# Patient Record
Sex: Male | Born: 1995 | Race: White | Hispanic: No | Marital: Married | Smoking: Never smoker
Health system: Southern US, Community
[De-identification: ages and names within clinical notes are randomized; demographics above are authoritative.]

## PROBLEM LIST (undated history)

## (undated) DIAGNOSIS — K8050 Calculus of bile duct without cholangitis or cholecystitis without obstruction: Secondary | ICD-10-CM

## (undated) HISTORY — PX: TONSILLECTOMY AND ADENOIDECTOMY: SUR1326

---
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
## 2018-05-02 ENCOUNTER — Other Ambulatory Visit: Payer: Self-pay

## 2018-05-02 ENCOUNTER — Ambulatory Visit
Admission: EM | Admit: 2018-05-02 | Discharge: 2018-05-02 | Disposition: A | Discharge status: Home / Self Care | Payer: PRIVATE HEALTH INSURANCE | Attending: Family Medicine | Admitting: Family Medicine

## 2018-05-02 DIAGNOSIS — R69 Illness, unspecified: Secondary | ICD-10-CM

## 2018-05-02 DIAGNOSIS — J111 Influenza due to unidentified influenza virus with other respiratory manifestations: Secondary | ICD-10-CM

## 2018-05-02 LAB — RAPID INFLUENZA A&B ANTIGENS: Influenza B (ARMC): NEGATIVE

## 2018-05-02 LAB — RAPID INFLUENZA A&B ANTIGENS (ARMC ONLY): INFLUENZA A (ARMC): NEGATIVE

## 2018-05-02 MED ORDER — OSELTAMIVIR PHOSPHATE 75 MG PO CAPS: 75.0000 mg | ORAL_CAPSULE | Freq: Two times a day (BID) | ORAL | 0 refills | Status: DC

## 2018-05-02 NOTE — ED Triage Notes (Signed)
Patient complains of body aches, fever, chills, muscle aches. Fever of 102 today-took tylenol around 2 hours ago, reports that wife is currently [redacted] weeks pregnant.

## 2018-05-02 NOTE — ED Provider Notes (Signed)
[facility] ____________________________________________  Time seen: Approximately 2:36 PM  I have reviewed the triage vital signs and the nursing notes.   HISTORY  Chief Complaint Fever   HPI Jacob Acevedo is a 22 y.o. male presented for evaluation of quick onset of chills, body aches, nasal congestion, nausea and diarrhea.  Reports only a few episodes of diarrhea today, described as normal in color.  States some nausea, no vomiting.  Some intermittent abdominal discomfort, denies current abdominal pain.  Denies cough or sore throat. Reports symptoms were quick in onset last night with accompanying fever.  Reports T-max 101.  Last took DayQuil with Tylenol naproxen 2 hours ago.  Current mild body aches.  Does report sick contacts at work, no home sick contacts.  Continues to drink fluids well, decrease in appetite.  Denies chest pain, shortness of breath, dysuria, rash, difficulty moving head or neck, or other complaints.  Reports healthy person.  Denies current medical problems.  Denies other aggravating alleviating factors.  Denies renal insufficiency.  History reviewed. No pertinent past medical history. Denies   There are no active problems to display for this patient.   Past Surgical History:  Procedure Laterality Date  . TONSILLECTOMY AND ADENOIDECTOMY       No current facility-administered medications for this encounter.   Current Outpatient Medications:  .  oseltamivir (TAMIFLU) 75 MG capsule, Take 1 capsule (75 mg total) by mouth every 12 (twelve) hours., Disp: 10 capsule, Rfl: 0  Allergies Patient has no known allergies.  Family History  Problem Relation Age of Onset  . Diabetes Father     Social History Social History   Tobacco Use  . Smoking status: Never Smoker  . Smokeless tobacco: Current User    Types: Snuff  Substance Use Topics  . Alcohol use: Yes    Comment: occasionally  . Drug use: Not Currently    Review of  Systems Constitutional: Positive fever ENT: No sore throat. Cardiovascular: Denies chest pain. Respiratory: Denies shortness of breath. Gastrointestinal: As above.  Denies blood in stool. Genitourinary: Negative for dysuria. Musculoskeletal: Negative for back pain. Skin: Negative for rash.   ____________________________________________   PHYSICAL EXAM:  VITAL SIGNS: ED Triage Vitals  Enc Vitals Group     BP 05/02/18 1339 129/72     Pulse Rate 05/02/18 1339 (!) 111     Resp 05/02/18 1339 18     Temp 05/02/18 1339 98.8 F (37.1 C)     Temp Source 05/02/18 1339 Oral     SpO2 05/02/18 1339 98 %     Weight 05/02/18 1340 220 lb (99.8 kg)     Height 05/02/18 1340 5\' 9"  (1.753 m)     Head Circumference --      Peak Flow --      Pain Score 05/02/18 1339 3     Pain Loc --      Pain Edu? --      Excl. in GC? --    Constitutional: Alert and oriented. Well appearing and in no acute distress. Eyes: Conjunctivae are normal.  Head: Atraumatic. No sinus tenderness to palpation. No swelling. No erythema.  Ears: no erythema, normal TMs bilaterally.   Nose:Nasal congestion  Mouth/Throat: Mucous membranes are moist. No pharyngeal erythema. No tonsillar swelling or exudate.  Neck: No stridor.  No cervical spine tenderness to palpation. Hematological/Lymphatic/Immunilogical: No cervical lymphadenopathy. Cardiovascular: Normal rate, regular rhythm. Grossly normal heart sounds.  Good peripheral circulation. Respiratory: Normal respiratory effort.  No retractions.  No wheezes, rales or rhonchi. Good air movement.  Gastrointestinal: Soft and nontender. Normal Bowel sounds. No CVA tenderness. Musculoskeletal: Ambulatory with steady gait. No cervical, thoracic or lumbar tenderness to palpation. Neurologic:  Normal speech and language. No gait instability. Skin:  Skin appears warm, dry and intact. No rash noted. Psychiatric: Mood and affect are normal. Speech and behavior are  normal.  ___________________________________________   LABS (all labs ordered are listed, but only abnormal results are displayed)  Labs Reviewed  RAPID INFLUENZA A&B ANTIGENS ([facility] ONLY)   ____________________________________________  RADIOLOGY  No results found. ____________________________________________   PROCEDURES Procedures    INITIAL IMPRESSION / ASSESSMENT AND PLAN / ED COURSE  Pertinent labs & imaging results that were available during my care of the patient were reviewed by me and considered in my medical decision making (see chart for details).  Well-appearing patient.  No acute distress.  Reports quick onset of above symptoms.  Suspect viral illness such as influenza.  Influenza test negative.  However suspect influenza-like illness.  Discussed use of Tamiflu, patient requests Rx.  Will treat with Tamiflu, over-the-counter Tylenol ibuprofen, rest, fluids, supportive care.Discussed indication, risks and benefits of medications with patient.  Discussed follow up with Primary care physician this week. Discussed follow up and return parameters including no resolution or any worsening concerns. Patient verbalized understanding and agreed to plan.   ____________________________________________   FINAL CLINICAL IMPRESSION(S) / ED DIAGNOSES  Final diagnoses:  Influenza-like illness     ED Discharge Orders         Ordered    oseltamivir (TAMIFLU) 75 MG capsule  Every 12 hours     05/02/18 1416           Note: This dictation was prepared with Dragon dictation along with smaller phrase technology. Any transcriptional errors that result from this process are unintentional.         Renford Dills, NP 05/02/18 1441

## 2018-05-02 NOTE — Discharge Instructions (Signed)
Take medication as prescribed. Rest. Drink plenty of fluids. Tylenol and ibuprofen as needed.  ° °Follow up with your primary care physician this week as needed. Return to Urgent care for new or worsening concerns.  ° °

## 2020-05-06 ENCOUNTER — Ambulatory Visit
Admission: EM | Admit: 2020-05-06 | Discharge: 2020-05-06 | Disposition: A | Discharge status: Home / Self Care | Payer: 59 | Attending: Emergency Medicine | Admitting: Emergency Medicine

## 2020-05-06 ENCOUNTER — Other Ambulatory Visit: Payer: Self-pay

## 2020-05-06 DIAGNOSIS — J069 Acute upper respiratory infection, unspecified: Secondary | ICD-10-CM

## 2020-05-06 MED ORDER — IPRATROPIUM BROMIDE 0.06 % NA SOLN: 2.0000 | Freq: Four times a day (QID) | NASAL | 0 refills | Status: DC | PRN

## 2020-05-06 NOTE — ED Provider Notes (Signed)
[facility]    CSN: [id number] Arrival date & time: 05/06/20  1415      History   Chief Complaint Chief Complaint  Patient presents with  . Nasal Congestion    HPI Jacob Acevedo is a 24 y.o. male.   Jacob Acevedo presents with complaints of congestion which started two days ago. Started out as nasal drainage and facial pressure. Today now with some cough and chest congestion as well. Did a telemedicine visit and was prescribed amoxicillin. Seeking further advice. States had similar about a month ago in which he took a course of amoxicillin, feels like symptoms improved but still had mild congestion which lingered. His son attends daycare and has had frequent URI's, currently with URI. [name] tested yesterday and today for covid-19 which was negative. No gi symptoms. He started zyrtec and flonase again two days ago. No shortness of breath . Cough is productive of mucus. Minimal sore throat. No ear pain. No skin rash. Works in [employer].     ROS per HPI, negative if not otherwise mentioned.      History reviewed. No pertinent past medical history.  There are no problems to display for this patient.   Past Surgical History:  Procedure Laterality Date  . TONSILLECTOMY AND ADENOIDECTOMY         Home Medications    Prior to Admission medications   Medication Sig Start Date End Date Taking? Authorizing Provider  ipratropium (ATROVENT) 0.06 % nasal spray Place 2 sprays into both nostrils 4 (four) times daily as needed for rhinitis. 05/06/20   Georgetta Haber, NP  oseltamivir (TAMIFLU) 75 MG capsule Take 1 capsule (75 mg total) by mouth every 12 (twelve) hours. 05/02/18   Renford Dills, NP    Family History Family History  Problem Relation Age of Onset  . Diabetes Father     Social History Social History   Tobacco Use  . Smoking status: Never Smoker  . Smokeless tobacco: Current User    Types: Snuff  Vaping Use  . Vaping Use: Never used    Substance Use Topics  . Alcohol use: Yes    Comment: occasionally  . Drug use: Not Currently     Allergies   Patient has no known allergies.   Review of Systems Review of Systems   Physical Exam Triage Vital Signs ED Triage Vitals  Enc Vitals Group     BP 05/06/20 1449 128/84     Pulse Rate 05/06/20 1449 93     Resp 05/06/20 1449 18     Temp 05/06/20 1449 98.5 F (36.9 C)     Temp Source 05/06/20 1449 Oral     SpO2 05/06/20 1449 100 %     Weight 05/06/20 1448 215 lb (97.5 kg)     Height 05/06/20 1448 5\' 10"  (1.778 m)     Head Circumference --      Peak Flow --      Pain Score 05/06/20 1448 7     Pain Loc --      Pain Edu? --      Excl. in GC? --    No data found.  Updated Vital Signs BP 128/84 (BP Location: Right Arm)   Pulse 93   Temp 98.5 F (36.9 C) (Oral)   Resp 18   Ht 5\' 10"  (1.778 m)   Wt 215 lb (97.5 kg)   SpO2 100%   BMI 30.85 kg/m   Visual Acuity Right Eye Distance:  Left Eye Distance:   Bilateral Distance:    Right Eye Near:   Left Eye Near:    Bilateral Near:     Physical Exam Constitutional:      Appearance: He is well-developed.  HENT:     Nose: Congestion and rhinorrhea present.     Right Sinus: No maxillary sinus tenderness or frontal sinus tenderness.     Left Sinus: No maxillary sinus tenderness or frontal sinus tenderness.     Mouth/Throat:     Mouth: Mucous membranes are moist.  Cardiovascular:     Rate and Rhythm: Normal rate.  Pulmonary:     Effort: Pulmonary effort is normal.     Comments: Occasional congested cough Skin:    General: Skin is warm and dry.  Neurological:     Mental Status: He is alert and oriented to person, place, and time.      UC Treatments / Results  Labs (all labs ordered are listed, but only abnormal results are displayed) Labs Reviewed - No data to display  EKG   Radiology No results found.  Procedures Procedures (including critical care time)  Medications Ordered in  UC Medications - No data to display  Initial Impression / Assessment and Plan / UC Course  I have reviewed the triage vital signs and the nursing notes.  Pertinent labs & imaging results that were available during my care of the patient were reviewed by me and considered in my medical decision making (see chart for details).     Non toxic. Benign physical exam.  History and physical consistent with viral illness.  Already has had negative covid testing. conntinue with supportive cares. Discussed taking amoxicillin as prescribed or holding off and waiting as this does not appear obviously bacterial in nature at this time. Return precautions provided. Patient verbalized understanding and agreeable to plan.   Final Clinical Impressions(s) / UC Diagnoses   Final diagnoses:  Upper respiratory tract infection, unspecified type     Discharge Instructions     Push fluids to ensure adequate hydration and keep secretions thin. Mucinex as an expectorant.  Warm compresses to your face or warm showers to promote drainage.  Continue with daily zyrtec and flonase.  You may start amoxicillin as prescribed, or you could wait until the end of the week to see how your symptoms are doing. If any improvement or no worsening you may not need at all.  If worsening- fevers, shortness of breath  or difficulty breathing or otherwise worsening don't hesitate to return.    ED Prescriptions    Medication Sig Dispense Auth. Provider   ipratropium (ATROVENT) 0.06 % nasal spray Place 2 sprays into both nostrils 4 (four) times daily as needed for rhinitis. 15 mL Georgetta Haber, NP     PDMP not reviewed this encounter.   Georgetta Haber, NP 05/06/20 1535

## 2020-05-06 NOTE — ED Triage Notes (Signed)
Patient complains of nasal congestion x 2 days. States that he was having to decongest with a shower. Reports that he feels like this has settled in his chest. Reports that he has taken 2 covid test since this started.

## 2020-05-06 NOTE — Discharge Instructions (Signed)
Push fluids to ensure adequate hydration and keep secretions thin. Mucinex as an expectorant.  Warm compresses to your face or warm showers to promote drainage.  Continue with daily zyrtec and flonase.  You may start amoxicillin as prescribed, or you could wait until the end of the week to see how your symptoms are doing. If any improvement or no worsening you may not need at all.  If worsening- fevers, shortness of breath  or difficulty breathing or otherwise worsening don't hesitate to return.

## 2020-08-21 ENCOUNTER — Other Ambulatory Visit: Payer: Self-pay

## 2020-08-21 ENCOUNTER — Encounter: Payer: Self-pay | Admitting: Emergency Medicine

## 2020-08-21 ENCOUNTER — Ambulatory Visit
Admission: RE | Admit: 2020-08-21 | Discharge: 2020-08-21 | Disposition: A | Discharge status: Home / Self Care | Payer: 59 | Source: Ambulatory Visit | Attending: Emergency Medicine | Admitting: Emergency Medicine

## 2020-08-21 ENCOUNTER — Telehealth: Payer: Self-pay | Admitting: Emergency Medicine

## 2020-08-21 ENCOUNTER — Ambulatory Visit
Admit: 2020-08-21 | Discharge: 2020-08-21 | Disposition: A | Discharge status: Home / Self Care | Payer: 59 | Attending: Emergency Medicine | Admitting: Emergency Medicine

## 2020-08-21 ENCOUNTER — Ambulatory Visit
Admission: EM | Admit: 2020-08-21 | Discharge: 2020-08-21 | Disposition: A | Discharge status: Home / Self Care | Payer: 59 | Attending: Emergency Medicine | Admitting: Emergency Medicine

## 2020-08-21 DIAGNOSIS — R1032 Left lower quadrant pain: Secondary | ICD-10-CM | POA: Insufficient documentation

## 2020-08-21 DIAGNOSIS — K6389 Other specified diseases of intestine: Secondary | ICD-10-CM | POA: Insufficient documentation

## 2020-08-21 DIAGNOSIS — K760 Fatty (change of) liver, not elsewhere classified: Secondary | ICD-10-CM | POA: Insufficient documentation

## 2020-08-21 LAB — URINALYSIS, COMPLETE (UACMP) WITH MICROSCOPIC
Bacteria, UA: NONE SEEN
Bilirubin Urine: NEGATIVE
Glucose, UA: NEGATIVE mg/dL
Hgb urine dipstick: NEGATIVE
Ketones, ur: NEGATIVE mg/dL
Leukocytes,Ua: NEGATIVE
Nitrite: NEGATIVE
Protein, ur: NEGATIVE mg/dL
RBC / HPF: NONE SEEN RBC/hpf (ref 0–5)
Specific Gravity, Urine: 1.025 (ref 1.005–1.030)
pH: 5 (ref 5.0–8.0)

## 2020-08-21 LAB — CBC WITH DIFFERENTIAL/PLATELET
Abs Immature Granulocytes: 0.02 10*3/uL (ref 0.00–0.07)
Basophils Absolute: 0 10*3/uL (ref 0.0–0.1)
Basophils Relative: 0 %
Eosinophils Absolute: 0 10*3/uL (ref 0.0–0.5)
Eosinophils Relative: 1 %
HCT: 45.8 % (ref 39.0–52.0)
Hemoglobin: 15.8 g/dL (ref 13.0–17.0)
Immature Granulocytes: 0 %
Lymphocytes Relative: 27 %
Lymphs Abs: 1.6 10*3/uL (ref 0.7–4.0)
MCH: 30.2 pg (ref 26.0–34.0)
MCHC: 34.5 g/dL (ref 30.0–36.0)
MCV: 87.4 fL (ref 80.0–100.0)
Monocytes Absolute: 0.6 10*3/uL (ref 0.1–1.0)
Monocytes Relative: 9 %
Neutro Abs: 3.8 10*3/uL (ref 1.7–7.7)
Neutrophils Relative %: 63 %
Platelets: 198 10*3/uL (ref 150–400)
RBC: 5.24 MIL/uL (ref 4.22–5.81)
RDW: 12.5 % (ref 11.5–15.5)
WBC: 6.1 10*3/uL (ref 4.0–10.5)
nRBC: 0 % (ref 0.0–0.2)

## 2020-08-21 LAB — COMPREHENSIVE METABOLIC PANEL
ALT: 101 U/L — ABNORMAL HIGH (ref 0–44)
AST: 37 U/L (ref 15–41)
Albumin: 4.7 g/dL (ref 3.5–5.0)
Alkaline Phosphatase: 46 U/L (ref 38–126)
Anion gap: 9 (ref 5–15)
BUN: 12 mg/dL (ref 6–20)
CO2: 26 mmol/L (ref 22–32)
Calcium: 9.4 mg/dL (ref 8.9–10.3)
Chloride: 104 mmol/L (ref 98–111)
Creatinine, Ser: 0.79 mg/dL (ref 0.61–1.24)
GFR, Estimated: >60 mL/min (ref >60)
Glucose, Bld: 102 mg/dL — ABNORMAL HIGH (ref 70–99)
Potassium: 4.4 mmol/L (ref 3.5–5.1)
Sodium: 139 mmol/L (ref 135–145)
Total Bilirubin: 1 mg/dL (ref 0.3–1.2)
Total Protein: 7.8 g/dL (ref 6.5–8.1)

## 2020-08-21 MED ORDER — IBUPROFEN 600 MG PO TABS
600.0000 mg | ORAL_TABLET | Freq: Four times a day (QID) | ORAL | 0 refills | Status: DC | PRN
Start: 2020-08-21 — End: 2023-09-15

## 2020-08-21 MED ORDER — IOHEXOL 300 MG/ML  SOLN
100.0000 mL | Freq: Once | INTRAMUSCULAR | Status: AC | PRN
  Administered 2020-08-21: 100 mL via INTRAVENOUS

## 2020-08-21 NOTE — ED Triage Notes (Signed)
Patient c/o left lower abdominal pain that started on Wed.  Patient states that he is not sure if he has a hernia.  Patient denies fevers.  Patient denies N/V/D.

## 2020-08-21 NOTE — Telephone Encounter (Signed)
CT scan report epiploic appendagitis.  These results were telephoned to the patient after verifying his identity through name and date of birth.  Will treat with 60 mg of ibuprofen 4 times daily for 5 to 6 days.

## 2020-08-21 NOTE — Discharge Instructions (Addendum)
Please go to the medical mall at [facility] for a CT scan of your abdomen and pelvis to better understand the cause of your left lower quadrant abdominal pain.

## 2020-08-21 NOTE — ED Notes (Signed)
Patient has been approved for CPT [id number]. Obtained via [employer] . Valid for 08/21/2020 through 10/05/2020. Authorization # [id number].

## 2020-08-21 NOTE — ED Provider Notes (Signed)
[facility]    CSN: [id number] Arrival date & time: 08/21/20  1016      History   Chief Complaint Chief Complaint  Patient presents with  . Abdominal Pain    HPI Jacob Acevedo is a 25 y.o. male.   HPI   25 year old male here for evaluation of abdominal pain.  Patient reports that he developed left lower quadrant abdominal pain 2 days ago.  He states that the pain is waxing and waning between a dull and a sharper but never completely goes away.  Patient states that yesterday he was feeling improved but then when it started to increase he thought might be gas, took some anti-gas medication, had a normal bowel movement, and the pain slightly improved.  Patient denies fever, nausea, vomiting, diarrhea, constipation, blood in the stool, or urinary signs or symptoms.  History reviewed. No pertinent past medical history.  There are no problems to display for this patient.   Past Surgical History:  Procedure Laterality Date  . TONSILLECTOMY AND ADENOIDECTOMY         Home Medications    Prior to Admission medications   Medication Sig Start Date End Date Taking? Authorizing Provider  ipratropium (ATROVENT) 0.06 % nasal spray Place 2 sprays into both nostrils 4 (four) times daily as needed for rhinitis. 05/06/20 08/21/20  Georgetta Haber, NP    Family History Family History  Problem Relation Age of Onset  . Diabetes Father     Social History Social History   Tobacco Use  . Smoking status: Never Smoker  . Smokeless tobacco: Current User    Types: Snuff, Chew  Vaping Use  . Vaping Use: Never used  Substance Use Topics  . Alcohol use: Yes    Comment: occasionally  . Drug use: Not Currently     Allergies   Patient has no known allergies.   Review of Systems Review of Systems  Constitutional: Negative for activity change, appetite change and fever.  Gastrointestinal: Positive for abdominal pain. Negative for blood in stool, constipation,  diarrhea, nausea and vomiting.  Genitourinary: Negative for dysuria, frequency, hematuria and urgency.  Skin: Negative.   Hematological: Negative.   Psychiatric/Behavioral: Negative.      Physical Exam Triage Vital Signs ED Triage Vitals [08/21/20 1028]  Enc Vitals Group     BP      Pulse      Resp      Temp      Temp src      SpO2      Weight 220 lb (99.8 kg)     Height 5\' 10"  (1.778 m)     Head Circumference      Peak Flow      Pain Score 4     Pain Loc      Pain Edu?      Excl. in GC?    No data found.  Updated Vital Signs BP 130/90 (BP Location: Right Arm)   Pulse 88   Temp 98.1 F (36.7 C) (Oral)   Resp 16   Ht 5\' 10"  (1.778 m)   Wt 220 lb (99.8 kg)   SpO2 100%   BMI 31.57 kg/m   Visual Acuity Right Eye Distance:   Left Eye Distance:   Bilateral Distance:    Right Eye Near:   Left Eye Near:    Bilateral Near:     Physical Exam Vitals and nursing note reviewed.  Constitutional:      General:  He is not in acute distress.    Appearance: He is well-developed. He is obese. He is not ill-appearing.  HENT:     Head: Normocephalic and atraumatic.  Cardiovascular:     Rate and Rhythm: Normal rate and regular rhythm.     Heart sounds: Normal heart sounds. No murmur heard. No gallop.   Pulmonary:     Effort: Pulmonary effort is normal.     Breath sounds: Normal breath sounds. No wheezing, rhonchi or rales.  Abdominal:     General: Abdomen is protuberant. Bowel sounds are normal. There is no distension.     Palpations: Abdomen is rigid. There is no hepatomegaly or splenomegaly.     Tenderness: There is abdominal tenderness in the left lower quadrant. There is no right CVA tenderness, left CVA tenderness, guarding or rebound.     Hernia: No hernia is present.  Genitourinary:    Penis: Normal.      Testes: Normal. Cremasteric reflex is present.        Right: Mass, tenderness or swelling not present.        Left: Mass, tenderness or swelling not present.   Skin:    General: Skin is warm and dry.     Capillary Refill: Capillary refill takes less than 2 seconds.     Findings: No erythema or rash.  Neurological:     General: No focal deficit present.     Mental Status: He is alert and oriented to person, place, and time.  Psychiatric:        Mood and Affect: Mood normal.        Behavior: Behavior normal.      UC Treatments / Results  Labs (all labs ordered are listed, but only abnormal results are displayed) Labs Reviewed  COMPREHENSIVE METABOLIC PANEL - Abnormal; Notable for the following components:      Result Value   Glucose, Bld 102 (*)    ALT 101 (*)    All other components within normal limits  CBC WITH DIFFERENTIAL/PLATELET  URINALYSIS, COMPLETE (UACMP) WITH MICROSCOPIC    EKG   Radiology No results found.  Procedures Procedures (including critical care time)  Medications Ordered in UC Medications - No data to display  Initial Impression / Assessment and Plan / UC Course  I have reviewed the triage vital signs and the nursing notes.  Pertinent labs & imaging results that were available during my care of the patient were reviewed by me and considered in my medical decision making (see chart for details).   Patient is a very pleasant 25 year old male here for evaluation of left lower abdominal pain that started 2 days ago.  Patient states that the pain is not a burning in nature it is usually just a dull ache which will increase to a sharp pain.  He states that he was awakened from sleep last night with a sharp pain that improved when he applied pressure to his left side of his abdomen and laid on his left side.  Patient has not had any fever, nausea, vomiting, diarrhea, constipation, or genitourinary symptoms.  Physical exam reveals a soft abdomen with left lower quadrant tenderness.  No hernia appreciated on abdominal exam.  Bowel sounds are positive in all 4 quadrants.  Genitourinary exam was unremarkable and there  is no presence of inguinal hernia.  Patient denies any history of constipation or diarrhea.  Differential includes diverticulitis versus early hernia.  Will obtain CBC, CMP, and obtain outpatient CT scan.  Urinalysis is unremarkable.  CMP shows a moderately elevated ALT at 101.  Remainder of transaminases are unremarkable.  Sodium potassium are normal as is the BUN and creatinine.  CBC is likewise unremarkable.  We will discharge patient with a diagnosis of left lower quadrant pain.  He has an outpatient CT scan scheduled at [facility] at the medical mall.  Final Clinical Impressions(s) / UC Diagnoses   Final diagnoses:  Left lower quadrant abdominal pain     Discharge Instructions     Please go to the medical mall at [facility] for a CT scan of your abdomen and pelvis to better understand the cause of your left lower quadrant abdominal pain.    ED Prescriptions    None     PDMP not reviewed this encounter.   Becky Augusta, NP 08/21/20 1205

## 2020-12-02 ENCOUNTER — Other Ambulatory Visit: Payer: Self-pay

## 2020-12-02 ENCOUNTER — Ambulatory Visit
Admission: EM | Admit: 2020-12-02 | Discharge: 2020-12-02 | Disposition: A | Discharge status: Home / Self Care | Payer: 59 | Attending: Internal Medicine | Admitting: Internal Medicine

## 2020-12-02 DIAGNOSIS — S61211A Laceration without foreign body of left index finger without damage to nail, initial encounter: Secondary | ICD-10-CM

## 2020-12-02 NOTE — Discharge Instructions (Addendum)
Keep wound clean and dry for 48 hours. Change the dressing if bleeding goes through. The bleeding should completely stop in a couple of hours with pressure dressing, but if you are soaking the dressing, go to ER since they will have to get hand surgeon to look for a nicked vessel. Stiches need removal in 10 days.  Watch for sign of infection, like redness, hotness or increased pain.

## 2020-12-02 NOTE — ED Provider Notes (Signed)
[facility]    CSN: [id number] Arrival date & time: 12/02/20  1151      History   Chief Complaint Chief Complaint  Patient presents with  . Laceration    Left index    HPI Jacob Acevedo is a 25 y.o. male who presents with L index finger laceration since this am. He smashed it between a hitch and a trailer.Last TD 2 days ago. Denies bone pain and is able to move his finger fine. He felt numbness right after, but ran cold water on his finger and that has resolved since.    History reviewed. No pertinent past medical history.  There are no problems to display for this patient.   Past Surgical History:  Procedure Laterality Date  . TONSILLECTOMY AND ADENOIDECTOMY         Home Medications    Prior to Admission medications   Medication Sig Start Date End Date Taking? Authorizing Provider  ibuprofen (ADVIL) 600 MG tablet Take 1 tablet (600 mg total) by mouth every 6 (six) hours as needed. 08/21/20   Becky Augusta, NP  ipratropium (ATROVENT) 0.06 % nasal spray Place 2 sprays into both nostrils 4 (four) times daily as needed for rhinitis. 05/06/20 08/21/20  Georgetta Haber, NP    Family History Family History  Problem Relation Age of Onset  . Diabetes Father     Social History Social History   Tobacco Use  . Smoking status: Never Smoker  . Smokeless tobacco: Current User    Types: Snuff, Chew  Vaping Use  . Vaping Use: Never used  Substance Use Topics  . Alcohol use: Yes    Comment: occasionally  . Drug use: Not Currently     Allergies   Patient has no known allergies.   Review of Systems Review of Systems  Musculoskeletal: Negative for arthralgias and joint swelling.  Skin: Positive for wound. Negative for color change and pallor.     Physical Exam Triage Vital Signs ED Triage Vitals  Enc Vitals Group     BP 12/02/20 1209 132/85     Pulse Rate 12/02/20 1209 83     Resp 12/02/20 1209 18     Temp 12/02/20 1209 98.1 F (36.7 C)      Temp Source 12/02/20 1209 Oral     SpO2 12/02/20 1209 100 %     Weight 12/02/20 1208 220 lb (99.8 kg)     Height 12/02/20 1208 5\' 10"  (1.778 m)     Head Circumference --      Peak Flow --      Pain Score 12/02/20 1208 2     Pain Loc --      Pain Edu? --      Excl. in GC? --    No data found.  Updated Vital Signs BP 132/85 (BP Location: Left Arm)   Pulse 83   Temp 98.1 F (36.7 C) (Oral)   Resp 18   Ht 5\' 10"  (1.778 m)   Wt 220 lb (99.8 kg)   SpO2 100%   BMI 31.57 kg/m   Visual Acuity Right Eye Distance:   Left Eye Distance:   Bilateral Distance:    Right Eye Near:   Left Eye Near:    Bilateral Near:     Physical Exam Vitals and nursing note reviewed.  Constitutional:      General: He is not in acute distress.    Appearance: He is not toxic-appearing.  HENT:  Head: Normocephalic.     Right Ear: External ear normal.     Left Ear: External ear normal.  Eyes:     General: No scleral icterus.    Conjunctiva/sclera: Conjunctivae normal.  Pulmonary:     Effort: Pulmonary effort is normal.  Musculoskeletal:        General: Normal range of motion.     Cervical back: Neck supple.     Comments: Does not have any bone pain of L index finger  Skin:    General: Skin is warm and dry.     Comments: Has a 2 cm clean laceration on the crease of volar L index finger distally. Has mild bleeding.   Neurological:     Mental Status: He is alert and oriented to person, place, and time.  Psychiatric:        Mood and Affect: Mood normal.        Behavior: Behavior normal.        Thought Content: Thought content normal.        Judgment: Judgment normal.      UC Treatments / Results  Labs (all labs ordered are listed, but only abnormal results are displayed) Labs Reviewed - No data to display  EKG   Radiology No results found.  Procedures Laceration Repair  Date/Time: 12/02/2020 12:43 PM Performed by: Garey Ham, PA-C Authorized by:  Garey Ham, PA-C   Consent:    Consent obtained:  Verbal   Consent given by:  Patient   Procedural risks discussed: has had sutures before. Anesthesia:    Anesthesia method:  Local infiltration   Local anesthetic:  Lidocaine 1% w/o epi Laceration details:    Location:  Finger   Finger location:  L index finger   Length (cm):  2   Depth (mm):  2 Pre-procedure details:    Preparation:  Patient was prepped and draped in usual sterile fashion Exploration:    Wound exploration: wound explored through full range of motion     Contaminated: no   Treatment:    Area cleansed with:  Soap and water   Amount of cleaning:  Standard   Visualized foreign bodies/material removed: no     Debridement:  None Skin repair:    Repair method:  Sutures   Suture size:  4-0   Suture technique:  Simple interrupted   Number of sutures:  3 Approximation:    Approximation:  Close Repair type:    Repair type:  Simple Post-procedure details:    Dressing:  Sterile dressing and antibiotic ointment   Procedure completion:  Tolerated well, no immediate complications Comments:     Splint placed    (including critical care time)  Medications Ordered in UC Medications - No data to display  Initial Impression / Assessment and Plan / UC Course  I have reviewed the triage vital signs and the nursing notes. Laceration L index finger.  See instructions.  Final Clinical Impressions(s) / UC Diagnoses   Final diagnoses:  None   Discharge Instructions   None    ED Prescriptions    None     PDMP not reviewed this encounter.   Garey Ham, [place] 12/02/20 1537

## 2020-12-02 NOTE — ED Triage Notes (Signed)
Pt c/o laceration to the left index finger this morning. Pt does have full ROM to the finger. Pt states finger was smashed between a hitch and a trailer. Pt reports his last Tetanus was 2 years ago.

## 2021-09-16 ENCOUNTER — Other Ambulatory Visit (HOSPITAL_COMMUNITY): Payer: Self-pay | Admitting: Family Medicine

## 2021-09-16 ENCOUNTER — Other Ambulatory Visit: Payer: Self-pay | Admitting: Family Medicine

## 2021-09-16 DIAGNOSIS — R7989 Other specified abnormal findings of blood chemistry: Secondary | ICD-10-CM

## 2021-09-22 ENCOUNTER — Ambulatory Visit
Admission: RE | Admit: 2021-09-22 | Discharge: 2021-09-22 | Disposition: A | Discharge status: Home / Self Care | Payer: 59 | Source: Ambulatory Visit | Attending: Family Medicine | Admitting: Family Medicine

## 2021-09-22 ENCOUNTER — Other Ambulatory Visit: Payer: Self-pay

## 2021-09-22 DIAGNOSIS — R7989 Other specified abnormal findings of blood chemistry: Secondary | ICD-10-CM | POA: Insufficient documentation

## 2021-09-29 ENCOUNTER — Other Ambulatory Visit: Payer: 59

## 2022-09-19 ENCOUNTER — Encounter: Payer: Self-pay | Admitting: Otolaryngology

## 2022-09-21 NOTE — Discharge Instructions (Signed)
[facility] ENDOSCOPIC SINUS SURGERY [facility]  What is Functional Endoscopic Sinus Surgery?  The Surgery involves making the natural openings of the sinuses larger by removing the bony partitions that separate the sinuses from the nasal cavity.  The natural sinus lining is preserved as much as possible to allow the sinuses to resume normal function after the surgery.  In some patients nasal polyps (excessively swollen lining of the sinuses) may be removed to relieve obstruction of the sinus openings.  The surgery is performed through the nose using lighted scopes, which eliminates the need for incisions on the face.  A septoplasty is a different procedure which is sometimes performed with sinus surgery.  It involves straightening the boy partition that separates the two sides of your nose.  A crooked or deviated septum may need repair if is obstructing the sinuses or nasal airflow.  Turbinate reduction is also often performed during sinus surgery.  The turbinates are bony proturberances from the side walls of the nose which swell and can obstruct the nose in patients with sinus and allergy problems.  Their size can be surgically reduced to help relieve nasal obstruction.  What Can Sinus Surgery Do For Me?  Sinus surgery can reduce the frequency of sinus infections requiring antibiotic treatment.  This can provide improvement in nasal congestion, post-nasal drainage, facial pressure and nasal obstruction.  Surgery will NOT prevent you from ever having an infection again, so it usually only for patients who get infections 4 or more times yearly requiring antibiotics, or for infections that do not clear with antibiotics.  It will not cure nasal allergies, so patients with allergies may still require medication to treat their allergies after surgery. Surgery may improve headaches related to sinusitis, however, some people will continue to  require medication to control sinus headaches related to allergies.  Surgery will do nothing for other forms of headache (migraine, tension or cluster).  What Are the Risks of Endoscopic Sinus Surgery?  Current techniques allow surgery to be performed safely with little risk, however, there are rare complications that patients should be aware of.  Because the sinuses are located around the eyes, there is risk of eye injury, including blindness, though again, this would be quite rare. This is usually a result of bleeding behind the eye during surgery, which can effect vision, though there are treatments to protect the vision and prevent permanent injury. More serious complications would include bleeding inside the brain cavity or damage to the brain.This happens when the fluid around the brain leaks out into the sinus cavity.  Again, all of these complications are uncommon, and spinal fluid leaks can be safely managed surgically if they occur.  The most common complication of sinus surgery is bleeding from the nose, which may require packing or cauterization of the nose.  Patients with polyps may experience recurrence of the polyps that would require revision surgery.  Alterations of sense of smell or injury to the tear ducts are also rare complications.   What is the Surgery Like, and what is the Recovery?  The Surgery usually takes a couple of hours to perform, and is usually performed under a general anesthetic (completely asleep).  Patients are usually discharged home after a couple of hours.  Sometimes during surgery it is necessary to pack the nose to control bleeding, and the packing is left in place for 24 - 48 hours, and removed by your surgeon.  If   a septoplasty was performed during the procedure, there is often a splint placed which must be removed after 5-7 days.   Discomfort: Pain is usually mild to moderate, and can be controlled by prescription pain medication or acetaminophen (Tylenol).   Aspirin, Ibuprofen (Advil, Motrin), or Naprosyn (Aleve) should be avoided, as they can cause increased bleeding.  Most patients feel sinus pressure like they have a bad head cold for several days.  Sleeping with your head elevated can help reduce swelling and facial pressure, as can ice packs over the face.  A humidifier may be helpful to keep the mucous and blood from drying in the nose.   Diet: There are no specific diet restrictions, however, you should generally start with clear liquids and a light diet of bland foods because the anesthetic can cause some nausea.  Advance your diet depending on how your stomach feels.  Taking your pain medication with food will often help reduce stomach upset which pain medications can cause.  Nasal Saline Irrigation: It is important to remove blood clots and dried mucous from the nose as it is healing.  This is done by having you irrigate the nose at least 3 - 4 times daily with a salt water solution.  We recommend using NeilMed Sinus Rinse (available at the drug store).  Fill the squeeze bottle with the solution, bend over a sink, and insert the tip of the squeeze bottle into the nose  of an inch.  Point the tip of the squeeze bottle towards the inside corner of the eye on the same side your irrigating.  Squeeze the bottle and gently irrigate the nose.  If you bend forward as you do this, most of the fluid will flow back out of the nose, instead of down your throat.   The solution should be warm, near body temperature, when you irrigate.   Each time you irrigate, you should use a full squeeze bottle.   Note that if you are instructed to use Nasal Steroid Sprays at any time after your surgery, irrigate with saline BEFORE using the steroid spray, so you do not wash it all out of the nose. Another product, Nasal Saline Gel (such as AYR Nasal Saline Gel) can be applied in each nostril 3 - 4 times daily to moisture the nose and reduce scabbing or crusting.  Bleeding:   Bloody drainage from the nose can be expected for several days, and patients are instructed to irrigate their nose frequently with salt water to help remove mucous and blood clots.  The drainage may be dark red or brown, though some fresh blood may be seen intermittently, especially after irrigation.  Do not blow you nose, as bleeding may occur. If you must sneeze, keep your mouth open to allow air to escape through your mouth.  If heavy bleeding occurs: Irrigate the nose with saline to rinse out clots, then spray the nose 3 - 4 times with Afrin Nasal Decongestant Spray.  The spray will constrict the blood vessels to slow bleeding.  Pinch the lower half of your nose shut to apply pressure, and lay down with your head elevated.  Ice packs over the nose may help as well. If bleeding persists despite these measures, you should notify your doctor.  Do not use the Afrin routinely to control nasal congestion after surgery, as it can result in worsening congestion and may affect healing.     Activity: Return to work varies among patients. Most patients will be out   of work at least 5 - 7 days to recover.  Patient may return to work after they are off of narcotic pain medication, and feeling well enough to perform the functions of their job.  Patients must avoid heavy lifting (over 10 pounds) or strenuous physical for 2 weeks after surgery, so your employer may need to assign you to light duty, or keep you out of work longer if light duty is not possible.  NOTE: you should not drive, operate dangerous machinery, do any mentally demanding tasks or make any important legal or financial decisions while on narcotic pain medication and recovering from the general anesthetic.    Call Your Doctor Immediately if You Have Any of the Following: Bleeding that you cannot control with the above measures Loss of vision, double vision, bulging of the eye or black eyes. Fever over 101 degrees Neck stiffness with severe headache,  fever, nausea and change in mental state. You are always encouraged to call anytime with concerns, however, please call with requests for pain medication refills during office hours.  Office Endoscopy: During follow-up visits your doctor will remove any packing or splints that may have been placed and evaluate and clean your sinuses endoscopically.  Topical anesthetic will be used to make this as comfortable as possible, though you may want to take your pain medication prior to the visit.  How often this will need to be done varies from patient to patient.  After complete recovery from the surgery, you may need follow-up endoscopy from time to time, particularly if there is concern of recurrent infection or nasal polyps.  

## 2022-09-29 ENCOUNTER — Ambulatory Visit
Admission: RE | Admit: 2022-09-29 | Discharge: 2022-09-29 | Disposition: A | Discharge status: Home / Self Care | Payer: 59 | Attending: Otolaryngology | Admitting: Otolaryngology

## 2022-09-29 ENCOUNTER — Other Ambulatory Visit: Payer: Self-pay

## 2022-09-29 ENCOUNTER — Encounter
Admission: RE | Disposition: A | Discharge status: Home / Self Care | Payer: Self-pay | Source: Home / Self Care | Attending: Otolaryngology

## 2022-09-29 ENCOUNTER — Encounter: Payer: Self-pay | Admitting: Otolaryngology

## 2022-09-29 ENCOUNTER — Ambulatory Visit: Payer: 59 | Admitting: Anesthesiology

## 2022-09-29 DIAGNOSIS — K219 Gastro-esophageal reflux disease without esophagitis: Secondary | ICD-10-CM | POA: Insufficient documentation

## 2022-09-29 DIAGNOSIS — F17210 Nicotine dependence, cigarettes, uncomplicated: Secondary | ICD-10-CM | POA: Diagnosis not present

## 2022-09-29 DIAGNOSIS — J343 Hypertrophy of nasal turbinates: Secondary | ICD-10-CM | POA: Insufficient documentation

## 2022-09-29 DIAGNOSIS — J342 Deviated nasal septum: Secondary | ICD-10-CM | POA: Diagnosis not present

## 2022-09-29 HISTORY — PX: NASAL TURBINATE REDUCTION: SHX2072

## 2022-09-29 HISTORY — PX: NASAL SEPTOPLASTY W/ TURBINOPLASTY: SHX2070

## 2022-09-29 SURGERY — SEPTOPLASTY, NOSE, WITH NASAL TURBINATE REDUCTION
Anesthesia: General | Site: Nose | Laterality: Right

## 2022-09-29 MED ORDER — DEXAMETHASONE SODIUM PHOSPHATE 4 MG/ML IJ SOLN
INTRAMUSCULAR | Status: DC | PRN
  Administered 2022-09-29: 12 mg via INTRAVENOUS

## 2022-09-29 MED ORDER — ONDANSETRON HCL 4 MG/2ML IJ SOLN
INTRAMUSCULAR | Status: DC | PRN
  Administered 2022-09-29: 4 mg via INTRAVENOUS

## 2022-09-29 MED ORDER — MIDAZOLAM HCL 5 MG/5ML IJ SOLN
INTRAMUSCULAR | Status: DC | PRN
  Administered 2022-09-29: 2 mg via INTRAVENOUS

## 2022-09-29 MED ORDER — OXYCODONE HCL 5 MG/5ML PO SOLN
10.0000 mg | Freq: Once | ORAL | Status: AC
  Administered 2022-09-29: 10 mg via ORAL

## 2022-09-29 MED ORDER — LIDOCAINE HCL (CARDIAC) PF 100 MG/5ML IV SOSY
PREFILLED_SYRINGE | INTRAVENOUS | Status: DC | PRN
  Administered 2022-09-29: 100 mg via INTRAVENOUS

## 2022-09-29 MED ORDER — ROCURONIUM BROMIDE 100 MG/10ML IV SOLN
INTRAVENOUS | Status: DC | PRN
  Administered 2022-09-29: 40 mg via INTRAVENOUS

## 2022-09-29 MED ORDER — HYDROCODONE-ACETAMINOPHEN 5-325 MG PO TABS: 1.0000 | ORAL_TABLET | Freq: Four times a day (QID) | ORAL | 0 refills | Status: AC | PRN

## 2022-09-29 MED ORDER — ACETAMINOPHEN 10 MG/ML IV SOLN
1000.0000 mg | Freq: Once | INTRAVENOUS | Status: AC
  Administered 2022-09-29: 1000 mg via INTRAVENOUS

## 2022-09-29 MED ORDER — DEXMEDETOMIDINE HCL IN NACL 80 MCG/20ML IV SOLN
INTRAVENOUS | Status: DC | PRN
  Administered 2022-09-29: 10 ug via BUCCAL

## 2022-09-29 MED ORDER — LACTATED RINGERS IV SOLN: INTRAVENOUS | Status: DC

## 2022-09-29 MED ORDER — PHENYLEPHRINE HCL 0.5 % NA SOLN
NASAL | Status: DC | PRN
  Administered 2022-09-29: 15 mL via TOPICAL

## 2022-09-29 MED ORDER — PREDNISONE 10 MG PO TABS: ORAL_TABLET | ORAL | 0 refills | Status: DC

## 2022-09-29 MED ORDER — PROPOFOL 10 MG/ML IV BOLUS
INTRAVENOUS | Status: DC | PRN
  Administered 2022-09-29: 200 mg via INTRAVENOUS

## 2022-09-29 MED ORDER — OXYMETAZOLINE HCL 0.05 % NA SOLN
2.0000 | NASAL | Status: AC
  Administered 2022-09-29: 2 via NASAL

## 2022-09-29 MED ORDER — CEPHALEXIN 500 MG PO CAPS: 500.0000 mg | ORAL_CAPSULE | Freq: Two times a day (BID) | ORAL | 0 refills | Status: DC

## 2022-09-29 MED ORDER — FENTANYL CITRATE (PF) 100 MCG/2ML IJ SOLN
INTRAMUSCULAR | Status: DC | PRN
  Administered 2022-09-29: 100 ug via INTRAVENOUS

## 2022-09-29 MED ORDER — DEXTROSE 5 % IV SOLN
2000.0000 mg | Freq: Once | INTRAVENOUS | Status: AC
  Administered 2022-09-29: 2000 mg via INTRAVENOUS

## 2022-09-29 MED ORDER — LIDOCAINE-EPINEPHRINE 1 %-1:100000 IJ SOLN
INTRAMUSCULAR | Status: DC | PRN
  Administered 2022-09-29: 6 mL

## 2022-09-29 MED ORDER — SUGAMMADEX SODIUM 200 MG/2ML IV SOLN
INTRAVENOUS | Status: DC | PRN
  Administered 2022-09-29: 200 mg via INTRAVENOUS

## 2022-09-29 SURGICAL SUPPLY — 27 items
CANISTER SUCT 1200ML W/VALVE (MISCELLANEOUS) ×2 IMPLANT
COAGULATOR SUCT 8FR VV (MISCELLANEOUS) ×2 IMPLANT
ELECT REM PT RETURN 9FT ADLT (ELECTROSURGICAL) ×2
ELECTRODE REM PT RTRN 9FT ADLT (ELECTROSURGICAL) ×2 IMPLANT
GLOVE SURG GAMMEX PI TX LF 7.5 (GLOVE) ×4 IMPLANT
GOWN STRL REUS W/ TWL LRG LVL3 (GOWN DISPOSABLE) ×2 IMPLANT
GOWN STRL REUS W/TWL LRG LVL3 (GOWN DISPOSABLE) ×2
KIT TURNOVER KIT A (KITS) ×2 IMPLANT
NDL ANESTHESIA 27G X 3.5 (NEEDLE) ×2 IMPLANT
NDL HYPO 27GX1-1/4 (NEEDLE) ×2 IMPLANT
NEEDLE ANESTHESIA  27G X 3.5 (NEEDLE)
NEEDLE ANESTHESIA 27G X 3.5 (NEEDLE) IMPLANT
NEEDLE HYPO 27GX1-1/4 (NEEDLE) ×2 IMPLANT
NS IRRIG 500ML POUR BTL (IV SOLUTION) ×2 IMPLANT
PACK ENT CUSTOM (PACKS) ×2 IMPLANT
PACKING NASAL EPIS 4X2.4 XEROG (MISCELLANEOUS) ×2 IMPLANT
PATTIES SURGICAL .5 X3 (DISPOSABLE) ×2 IMPLANT
SPLINT NASAL SEPTAL BLV .50 ST (MISCELLANEOUS) ×2 IMPLANT
STRAP BODY AND KNEE 60X3 (MISCELLANEOUS) ×2 IMPLANT
SUT CHROMIC 3-0 (SUTURE) ×2
SUT CHROMIC 3-0 KS 27XMFL CR (SUTURE) ×2
SUT ETHILON 3-0 KS 30 BLK (SUTURE) ×2 IMPLANT
SUT PLAIN GUT 4-0 (SUTURE) ×2 IMPLANT
SUTURE CHRMC 3-0 KS 27XMFL CR (SUTURE) ×2 IMPLANT
SYR 3ML LL SCALE MARK (SYRINGE) ×2 IMPLANT
TOWEL OR 17X26 4PK STRL BLUE (TOWEL DISPOSABLE) ×2 IMPLANT
WATER STERILE IRR 250ML POUR (IV SOLUTION) ×2 IMPLANT

## 2022-09-29 NOTE — H&P (Signed)
H&P has been reviewed and patient reevaluated, no changes necessary. To be downloaded later.  

## 2022-09-29 NOTE — Anesthesia Procedure Notes (Addendum)
Procedure Name: Intubation Date/Time: 09/29/2022 9:57 AM  Performed by: Sekai Gitlin, [place], CRNAPre-anesthesia Checklist: Patient identified, Patient being monitored, Timeout performed, Emergency Drugs available and Suction available Patient Re-evaluated:Patient Re-evaluated prior to induction Oxygen Delivery Method: Circle system utilized Preoxygenation: Pre-oxygenation with 100% oxygen Induction Type: IV induction Ventilation: Mask ventilation without difficulty Laryngoscope Size: Mac and 4 Grade View: Grade I Tube type: Oral Rae Tube size: 7.0 mm Number of attempts: 1 Airway Equipment and Method: Stylet Placement Confirmation: ETT inserted through vocal cords under direct vision, positive ETCO2 and breath sounds checked- equal and bilateral Secured at: 22 cm Tube secured with: Tape Dental Injury: Teeth and Oropharynx as per pre-operative assessment

## 2022-09-29 NOTE — Anesthesia Preprocedure Evaluation (Addendum)
Anesthesia Evaluation  Patient identified by MRN, date of birth, ID band Patient awake    Airway Mallampati: III  TM Distance: >3 FB     Dental no notable dental hx. (+) Teeth Intact Potentially need videolaryngoscope for intubation:   Pulmonary Current Smoker and Patient abstained from smoking. Seasonal allergies   breath sounds clear to auscultation       Cardiovascular Exercise Tolerance: Good negative cardio ROS  Rhythm:Regular Rate:Normal     Neuro/Psych negative neurological ROS  negative psych ROS   GI/Hepatic Neg liver ROS,GERD  Controlled and Medicated,,  Endo/Other  negative endocrine ROS    Renal/GU negative Renal ROS     Musculoskeletal negative musculoskeletal ROS (+)    Abdominal   Peds  Hematology negative hematology ROS (+)   Anesthesia Other Findings   Reproductive/Obstetrics                              Anesthesia Physical Anesthesia Plan  ASA: 2  Anesthesia Plan: General   Post-op Pain Management:    Induction: Intravenous  PONV Risk Score and Plan: 2  Airway Management Planned: Oral ETT and Video Laryngoscope Planned  Additional Equipment:   Intra-op Plan:   Post-operative Plan:   Informed Consent: I have reviewed the patients History and Physical, chart, labs and discussed the procedure including the risks, benefits and alternatives for the proposed anesthesia with the patient or authorized representative who has indicated his/her understanding and acceptance.       Plan Discussed with: CRNA  Anesthesia Plan Comments:         Anesthesia Quick Evaluation

## 2022-09-29 NOTE — Anesthesia Postprocedure Evaluation (Signed)
Anesthesia Post Note  Patient: Jacob Acevedo  Procedure(s) Performed: NASAL SEPTOPLASTY WITH INFERIOR TURBINATE REDUCTION (Bilateral: Nose) MIDDLE TURBINATE REDUCTION (Right: Nose)  Patient location during evaluation: PACU Anesthesia Type: General Level of consciousness: awake and alert Pain management: pain level controlled Vital Signs Assessment: post-procedure vital signs reviewed and stable Respiratory status: spontaneous breathing, nonlabored ventilation, respiratory function stable and patient connected to nasal cannula oxygen Cardiovascular status: blood pressure returned to baseline and stable Postop Assessment: no apparent nausea or vomiting Anesthetic complications: no   No notable events documented.   Last Vitals:  Vitals:   09/29/22 1115 09/29/22 1130  BP: 112/66 116/74  Pulse: 77 81  Resp:    Temp:  36.4 C  SpO2: 93% 95%    Last Pain:  Vitals:   09/29/22 1123  TempSrc:   PainSc: Asleep                 [name]

## 2022-09-29 NOTE — Op Note (Signed)
09/29/2022  10:54 AM  [id number]   Pre-Op Dx:  Deviated Nasal Septum, Hypertrophic Inferior Turbinates, enlarged right middle turbinate  Post-op Dx: Same  Proc: Nasal Septoplasty, Bilateral Partial Reduction Inferior Turbinates   Surg:  Elon Alas Lashun Mccants  Anes:  GOT  EBL: 50 mL  Comp: None  Findings: Inferior spurs of the maxillary crest to the left side.  The cartilage was overhanging on the right inferiorly.  The ethmoid plate was buckled to the left side.  Procedure: With the patient in a comfortable supine position,  general orotracheal anesthesia was induced without difficulty.     The patient received preoperative Afrin spray for topical decongestion and vasoconstriction.  Intravenous prophylactic antibiotics were administered.  At an appropriate level, the patient was placed in a semi-sitting position.  Nasal vibrissae were trimmed.   1% Xylocaine with 1:100,000 epinephrine, 6 cc's, was infiltrated into the anterior floor of the nose, into the nasal spine region, into the membranous columella, and finally into the submucoperichondrial plane of the septum on both sides.  Several minutes were allowed for this to take effect.  Cottoniod pledgetts soaked in Afrin and 4% Xylocaine were placed into both nasal cavities and left while the patient was prepped and draped in the standard fashion.  The materials were removed from the nose and observed to be intact and correct in number.  The nose was inspected with a headlight and zero degree scope with the findings as described above.  A left Killian incision was sharply executed and carried down to the quadrangular cartilage. The mucoperichondrium was elelvated along the quadrangular plate back to the bony-cartilaginous junction. The mucoperiostium was then elevated along the ethmoid plate and the vomer. The boney-catilaginous junction was then split with a freer elevator and the mucoperiosteum was elevated on the opposite side. The  mucoperiosteum was then elevated along the maxillary crest as needed to expose the crooked bone of the crest.  Boney spurs of the vomer and maxillary crest were removed with Donavan Foil forceps.  The cartilaginous plate was trimmed along its posterior and inferior borders of about 2 mm of cartilage to free it up inferiorly. Some of the deviated ethmoid plate was then fractured and removed with Takahashi forceps to free up the posterior border of the quadrangular plate and allow it to swing back to the midline. The mucosal flaps were placed back into their anatomic position to allow visualization of the airways. The septum now sat in the midline with an improved airway.  A 3-0 Chromic suture on a Keith needle in used to anchor the inferior septum at the nasal spine with a through and through suture. The mucosal flaps are then sutured together using a through and through whip stitch of 4-0 Plain Gut with a mini-Keith needle. This was used to close the [place] incision as well.   The inferior turbinates were then inspected. An incision was created along the inferior aspect of the left inferior turbinate with removal of some of the inferior soft tissue and bone. Electrocautery was used to control bleeding in the area. The remaining turbinate was then outfractured to open up the airway further. There was no significant bleeding noted. The right turbinate was then trimmed and outfractured in a similar fashion.  The airways were then visualized and showed open passageways on both sides that were significantly improved compared to before surgery. There was no signifcant bleeding. Nasal splints were applied to both sides of the septum using Xomed 0.57mm regular sized splints that  were trimmed, and then held in position with a 3-0 Nylon through and through suture.  The patient was turned back over to anesthesia, and awakened, extubated, and taken to the PACU in satisfactory condition.  Dispo:   PACU to home  Plan:  Ice, elevation, narcotic analgesia, steroid taper, and prophylactic antibiotics for the duration of indwelling nasal foreign bodies.  We will reevaluate the patient in the office in 6 days and remove the septal splints.  Return to work in 10 days, strenuous activities in two weeks.   Elon Alas Deneene Tarver 09/29/2022 10:54 AM

## 2022-09-29 NOTE — Transfer of Care (Signed)
Immediate Anesthesia Transfer of Care Note  Patient: Jacob Acevedo  Procedure(s) Performed: NASAL SEPTOPLASTY WITH INFERIOR TURBINATE REDUCTION (Bilateral: Nose) MIDDLE TURBINATE REDUCTION (Right: Nose)  Patient Location: PACU  Anesthesia Type: General  Level of Consciousness: awake, alert  and patient cooperative  Airway and Oxygen Therapy: Patient Spontanous Breathing and Patient connected to supplemental oxygen  Post-op Assessment: Post-op Vital signs reviewed, Patient's Cardiovascular Status Stable, Respiratory Function Stable, Patent Airway and No signs of Nausea or vomiting  Post-op Vital Signs: Reviewed and stable  Complications: No notable events documented.

## 2022-09-30 ENCOUNTER — Encounter: Payer: Self-pay | Admitting: Otolaryngology

## 2023-05-16 IMAGING — US US ABDOMEN COMPLETE
1 series · 14 of 25 positions shown · non-contrast
Comparison: 08/21/2020

CLINICAL DATA: Elevated LFTs

EXAM:
ABDOMEN ULTRASOUND COMPLETE

[Series 1: us abdomen complete · 0.25mm/px · 14 of 82 slices shown]
[im 1/82]
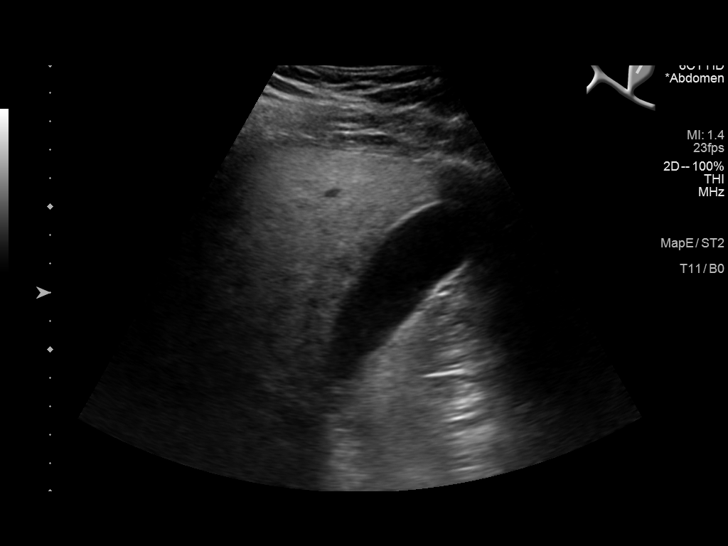
[im 7/82]
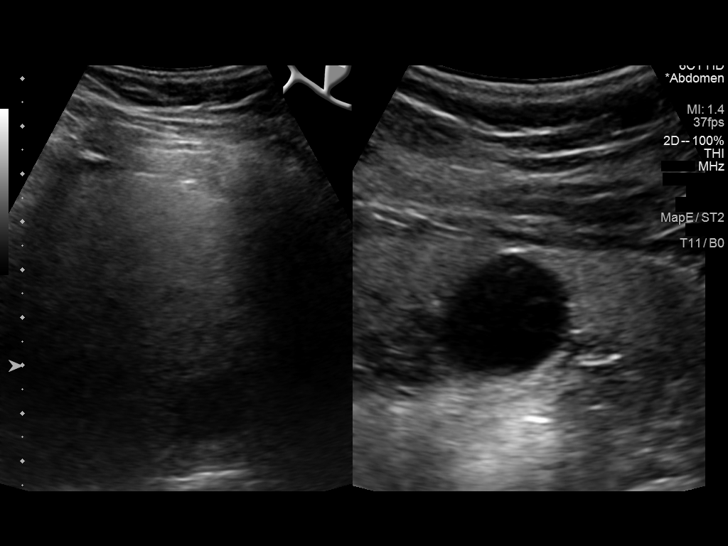
[im 14/82]
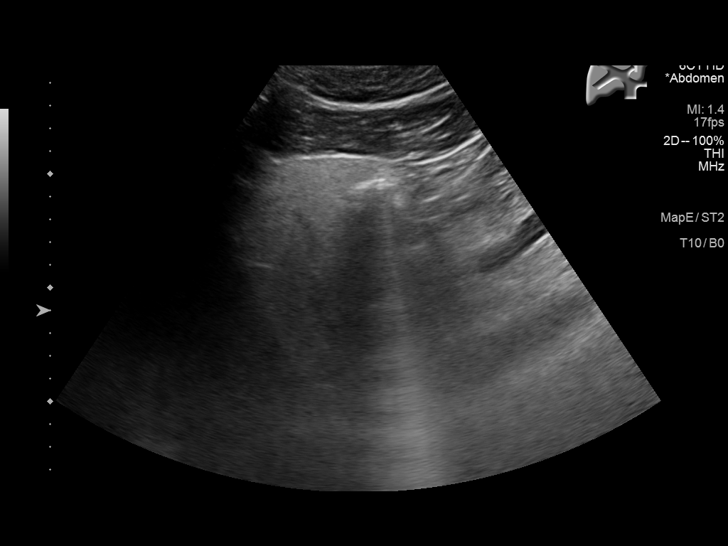
[im 21/82]
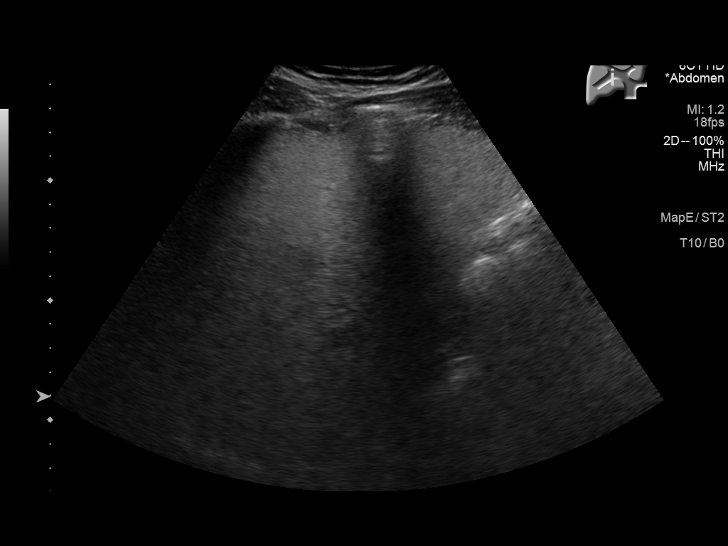
[im 28/82]
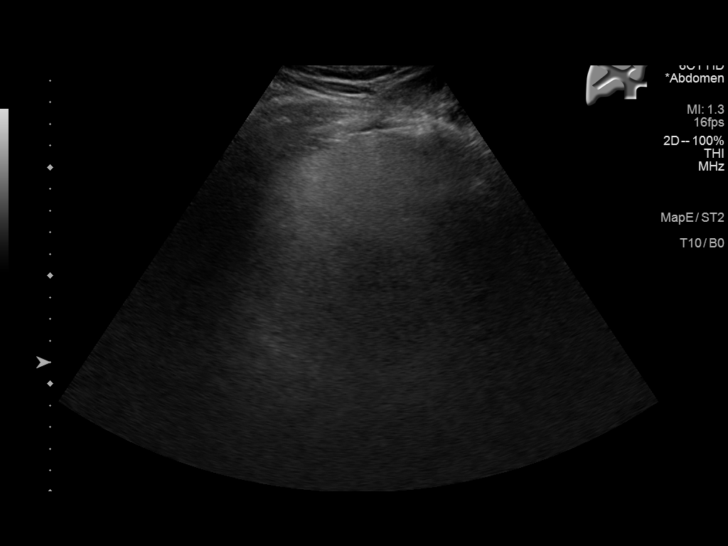
[im 31/82]
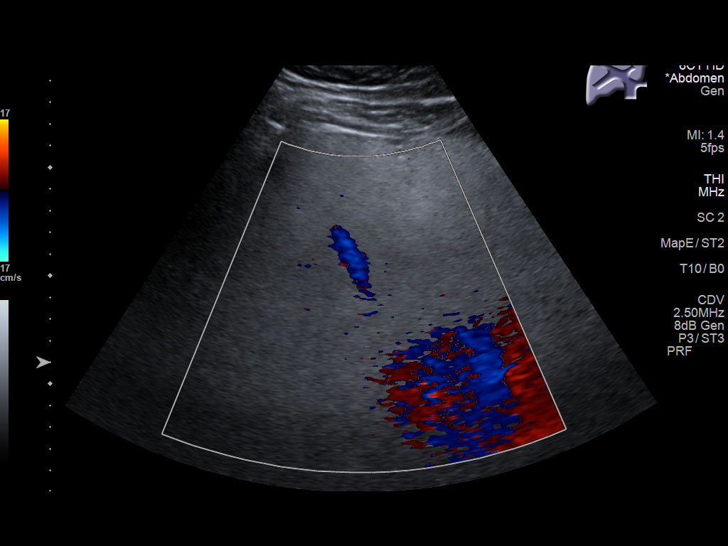
[im 38/82]
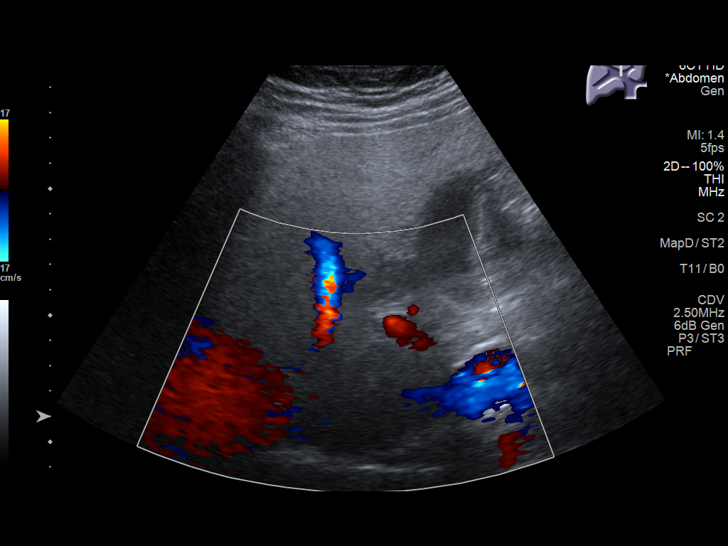
[im 44/82]
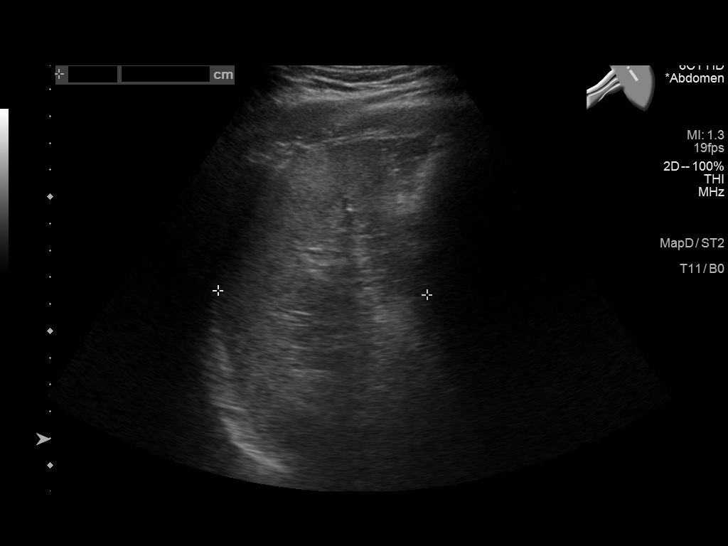
[im 51/82]
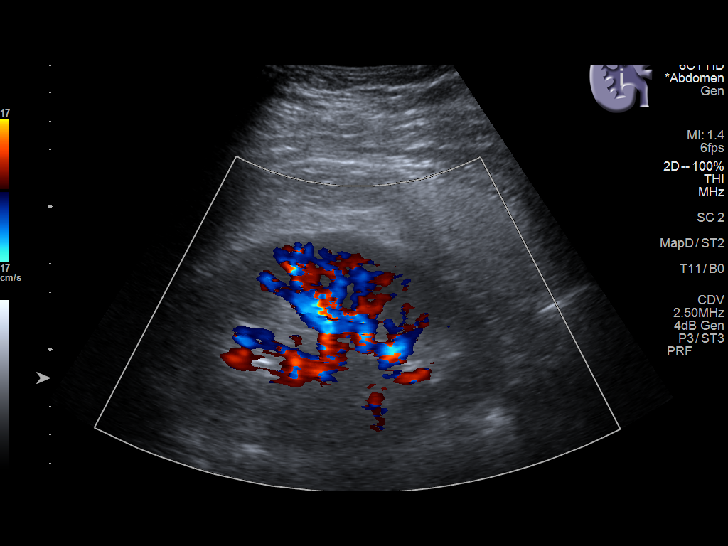
[im 55/82]
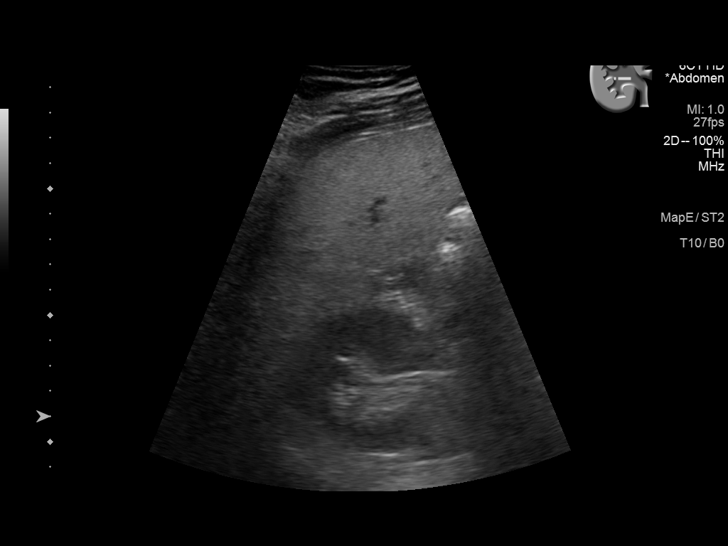
[im 61/82]
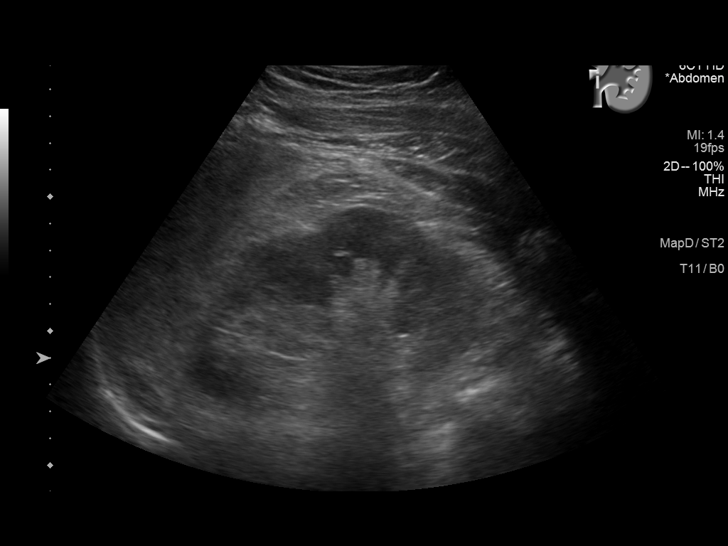
[im 68/82]
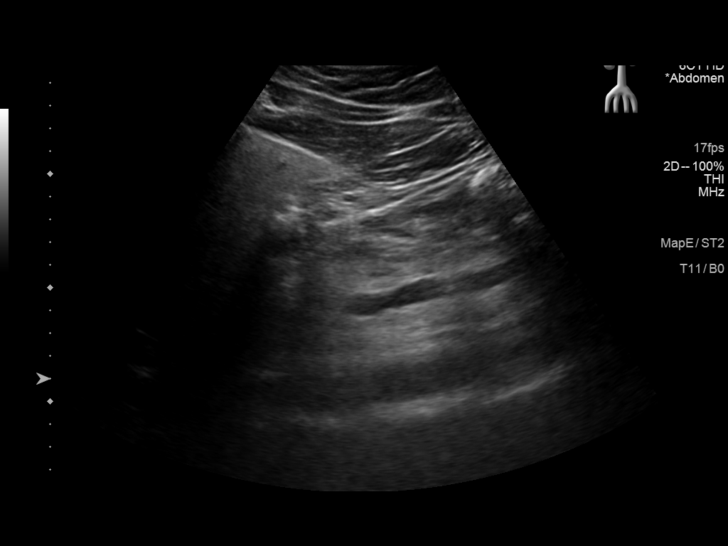
[im 75/82]
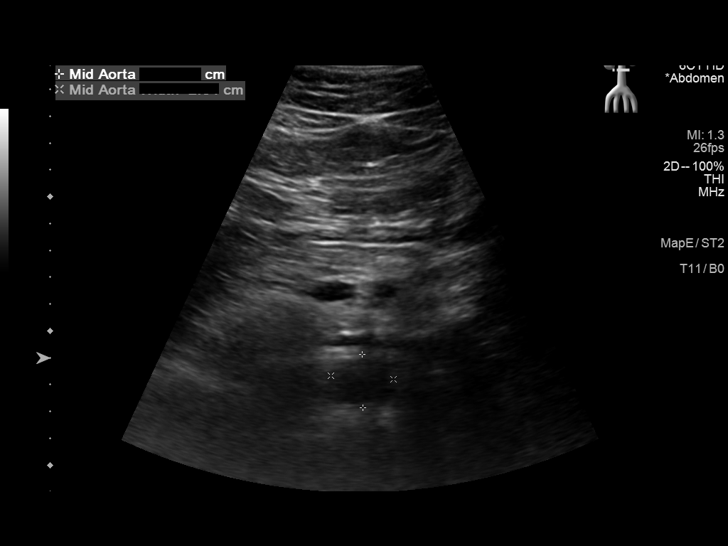
[im 82/82]
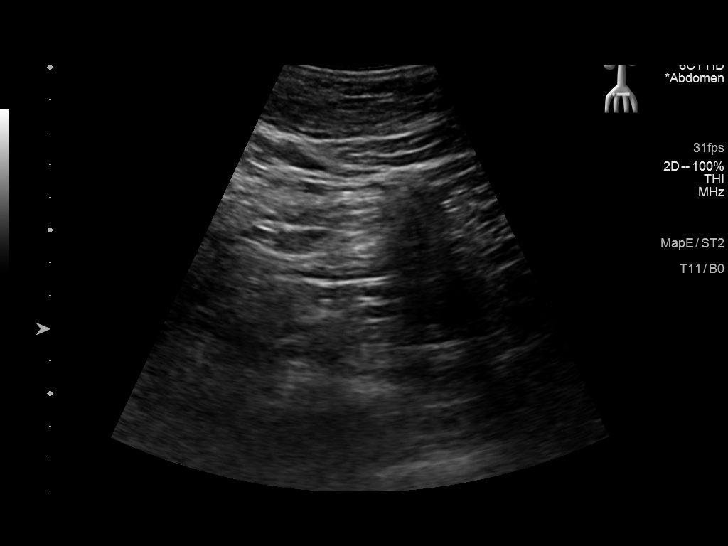

[14 of 25 positions shown; findings below may reference images not displayed]

FINDINGS: Gallbladder: No gallstones or wall thickening visualized. No
sonographic Murphy sign noted by sonographer.

Common bile duct: Diameter: 4.1 mm

Liver: Diffusely increased in echogenicity consistent with fatty
infiltration. No focal mass is noted. Portal vein is patent on color
Doppler imaging with normal direction of blood flow towards the
liver.

IVC: No abnormality visualized.

Pancreas: Visualized portion unremarkable.

Spleen: Size and appearance within normal limits.

Right Kidney: Length: 11.9 cm. Echogenicity within normal limits.
No mass or hydronephrosis visualized.

Left Kidney: Length: 11.8 cm. Echogenicity within normal limits. No
mass or hydronephrosis visualized.

Abdominal aorta: No aneurysm visualized.

Other findings: None.
IMPRESSION: Fatty liver.

No acute abnormality noted.

## 2023-09-15 ENCOUNTER — Ambulatory Visit
Admission: EM | Admit: 2023-09-15 | Discharge: 2023-09-15 | Disposition: A | Discharge status: Home / Self Care | Attending: Emergency Medicine | Admitting: Emergency Medicine

## 2023-09-15 ENCOUNTER — Encounter: Payer: Self-pay | Admitting: Emergency Medicine

## 2023-09-15 DIAGNOSIS — K529 Noninfective gastroenteritis and colitis, unspecified: Secondary | ICD-10-CM | POA: Insufficient documentation

## 2023-09-15 LAB — RESP PANEL BY RT-PCR (FLU A&B, COVID) ARPGX2
Influenza A by PCR: NEGATIVE
Influenza B by PCR: NEGATIVE
SARS Coronavirus 2 by RT PCR: NEGATIVE

## 2023-09-15 MED ORDER — ONDANSETRON 8 MG PO TBDP: 8.0000 mg | ORAL_TABLET | Freq: Three times a day (TID) | ORAL | 0 refills | Status: DC | PRN

## 2023-09-15 NOTE — ED Triage Notes (Signed)
 Pt c/o diarrhea, nausea, vomiting x1day  Pt states that he woke up with diarrhea and vomiting this morning every until 11am.   Pt states that he took his temperature and it was 102  Pt has not had any medication for his fever.

## 2023-09-15 NOTE — ED Provider Notes (Signed)
 [facility]    CSN: [id number] Arrival date & time: 09/15/23  1856      History   Chief Complaint Chief Complaint  Patient presents with   Vomiting   Diarrhea    HPI Jacob Acevedo is a 28 y.o. male.   HPI  28 year old male with no significant past medical history presents for evaluation of nausea, vomiting, diarrhea that started abruptly at 3 AM this morning.  He also reports that he developed a fever at that time of 102.  He denies any runny nose, nasal congestion, sore throat, ear pain, or cough.  No recent travel or known sick contacts.  He denies eating anything that was suspect.  History reviewed. No pertinent past medical history.  There are no active problems to display for this patient.   Past Surgical History:  Procedure Laterality Date   NASAL SEPTOPLASTY W/ TURBINOPLASTY Bilateral 09/29/2022   Procedure: NASAL SEPTOPLASTY WITH INFERIOR TURBINATE REDUCTION;  Surgeon: Vernie Murders, MD;  Location: [facility];  Service: ENT;  Laterality: Bilateral;   NASAL TURBINATE REDUCTION Right 09/29/2022   Procedure: MIDDLE TURBINATE REDUCTION;  Surgeon: Vernie Murders, MD;  Location: [facility];  Service: ENT;  Laterality: Right;   TONSILLECTOMY AND ADENOIDECTOMY         Home Medications    Prior to Admission medications   Medication Sig Start Date End Date Taking? Authorizing Provider  montelukast (SINGULAIR) 10 MG tablet Take 10 mg by mouth at bedtime.   Yes [provider]  omeprazole (PRILOSEC) 10 MG capsule Take 10 mg by mouth daily.   Yes [provider]  ondansetron (ZOFRAN-ODT) 8 MG disintegrating tablet Take 1 tablet (8 mg total) by mouth every 8 (eight) hours as needed for nausea or vomiting. 09/15/23  Yes Becky Augusta, NP  ipratropium (ATROVENT) 0.06 % nasal spray Place 2 sprays into both nostrils 4 (four) times daily as needed for rhinitis. 05/06/20 08/21/20  Georgetta Haber, NP    Family History Family History   Problem Relation Age of Onset   Diabetes Father     Social History Social History   Tobacco Use   Smoking status: Never   Smokeless tobacco: Current    Types: Snuff, Chew  Vaping Use   Vaping status: Never Used  Substance Use Topics   Alcohol use: Yes    Comment: occasionally   Drug use: Not Currently     Allergies   Patient has no known allergies.   Review of Systems Review of Systems  Constitutional:  Positive for fever.  HENT:  Negative for congestion, ear pain, rhinorrhea and sore throat.   Respiratory:  Negative for cough.   Gastrointestinal:  Positive for diarrhea, nausea and vomiting.     Physical Exam Triage Vital Signs ED Triage Vitals  Encounter Vitals Group     BP      Systolic BP Percentile      Diastolic BP Percentile      Pulse      Resp      Temp      Temp src      SpO2      Weight      Height      Head Circumference      Peak Flow      Pain Score      Pain Loc      Pain Education      Exclude from Growth Chart    No data found.  Updated Vital Signs BP (!) 139/92 (BP Location: Left Arm)   Pulse (!) 117   Temp (!) 100.4 F (38 C) (Oral)   Resp 20   Ht 5\' 10"  (1.778 m)   Wt 227 lb (103 kg)   SpO2 99%   BMI 32.57 kg/m   Visual Acuity Right Eye Distance:   Left Eye Distance:   Bilateral Distance:    Right Eye Near:   Left Eye Near:    Bilateral Near:     Physical Exam Vitals and nursing note reviewed.  Constitutional:      Appearance: Normal appearance. He is not ill-appearing.  HENT:     Head: Normocephalic and atraumatic.     Right Ear: Tympanic membrane, ear canal and external ear normal. There is no impacted cerumen.     Left Ear: Tympanic membrane, ear canal and external ear normal. There is no impacted cerumen.     Nose: Nose normal.     Mouth/Throat:     Mouth: Mucous membranes are moist.     Pharynx: Oropharynx is clear. No oropharyngeal exudate or posterior oropharyngeal erythema.  Cardiovascular:     Rate  and Rhythm: Normal rate and regular rhythm.     Pulses: Normal pulses.     Heart sounds: Normal heart sounds. No murmur heard.    No friction rub. No gallop.  Pulmonary:     Effort: Pulmonary effort is normal.     Breath sounds: Normal breath sounds. No wheezing, rhonchi or rales.  Abdominal:     General: Abdomen is flat.     Palpations: Abdomen is soft.     Tenderness: There is no abdominal tenderness. There is no guarding or rebound.  Musculoskeletal:     Cervical back: Normal range of motion and neck supple. No tenderness.  Lymphadenopathy:     Cervical: No cervical adenopathy.  Skin:    General: Skin is warm and dry.     Capillary Refill: Capillary refill takes less than 2 seconds.     Findings: No rash.  Neurological:     General: No focal deficit present.     Mental Status: He is alert and oriented to person, place, and time.      UC Treatments / Results  Labs (all labs ordered are listed, but only abnormal results are displayed) Labs Reviewed  RESP PANEL BY RT-PCR (FLU A&B, COVID) [id number]    EKG   Radiology No results found.  Procedures Procedures (including critical care time)  Medications Ordered in UC Medications - No data to display  Initial Impression / Assessment and Plan / UC Course  I have reviewed the triage vital signs and the nursing notes.  Pertinent labs & imaging results that were available during my care of the patient were reviewed by me and considered in my medical decision making (see chart for details).   Patient is a pleasant, nontoxic-appearing 28 year old male presenting for evaluation of fever and GI complaints outlined HPI above.  He denies any upper or lower respiratory complaints and his upper and lower respiratory physical exam are unremarkable.  Abdomen is protuberant but soft and nontender.  Differential diagnose include COVID, influenza, food poisoning, viral GI illness.  Food poisoning is less in the differential given that he did  not eat anything that tasted suspect.  He also was not had any recent travel.  I will order a COVID and flu PCR.  Respiratory panel is negative for COVID or influenza.  I will discharge patient  home with a diagnosis of viral gastroenteritis and have him use over-the-counter Tylenol and/or ibuprofen as needed for fever.  I will prescribe him Zofran that he can use every 8 hours as needed for nausea or vomiting.  Return precautions reviewed.   Final Clinical Impressions(s) / UC Diagnoses   Final diagnoses:  Gastroenteritis     Discharge Instructions      Take the Zofran every 8 hours as needed for nausea and vomiting.  They are an oral disintegrating tablet and you can place them on her under your tongue and then will be absorbed.  Use over-the-counter Tylenol and/or ibuprofen as needed for fever.  Follow a clear liquid diet for the next 6 to 12 hours.  Clear liquids consist of broth, ginger ale, water, Pedialyte, and Jell-O.  After 6 to 12 hours, if you are tolerating clear liquids, you can advance to bland foods such as bananas, rice, applesauce, and toast.  If you tolerate bland foods you can continue to advance your diet as you see fit.  If you develop a fever over 100.5, increased abdominal pain, bloody vomit, or bloody stool return for reevaluation or go to the ER.      ED Prescriptions     Medication Sig Dispense Auth. Provider   ondansetron (ZOFRAN-ODT) 8 MG disintegrating tablet Take 1 tablet (8 mg total) by mouth every 8 (eight) hours as needed for nausea or vomiting. 20 tablet Becky Augusta, NP      PDMP not reviewed this encounter.   Becky Augusta, NP 09/15/23 2008

## 2023-09-15 NOTE — Discharge Instructions (Addendum)
 Take the Zofran every 8 hours as needed for nausea and vomiting.  They are an oral disintegrating tablet and you can place them on her under your tongue and then will be absorbed.  Use over-the-counter Tylenol and/or ibuprofen as needed for fever.  Follow a clear liquid diet for the next 6 to 12 hours.  Clear liquids consist of broth, ginger ale, water, Pedialyte, and Jell-O.  After 6 to 12 hours, if you are tolerating clear liquids, you can advance to bland foods such as bananas, rice, applesauce, and toast.  If you tolerate bland foods you can continue to advance your diet as you see fit.  If you develop a fever over 100.5, increased abdominal pain, bloody vomit, or bloody stool return for reevaluation or go to the ER.

## 2023-10-26 ENCOUNTER — Other Ambulatory Visit: Payer: Self-pay | Admitting: Family Medicine

## 2023-10-26 DIAGNOSIS — R1011 Right upper quadrant pain: Secondary | ICD-10-CM

## 2023-10-26 DIAGNOSIS — R112 Nausea with vomiting, unspecified: Secondary | ICD-10-CM

## 2023-10-27 ENCOUNTER — Ambulatory Visit
Admission: RE | Admit: 2023-10-27 | Discharge: 2023-10-27 | Disposition: A | Discharge status: Home / Self Care | Source: Ambulatory Visit | Attending: Family Medicine | Admitting: Family Medicine

## 2023-10-27 DIAGNOSIS — R112 Nausea with vomiting, unspecified: Secondary | ICD-10-CM

## 2023-10-27 DIAGNOSIS — R1011 Right upper quadrant pain: Secondary | ICD-10-CM | POA: Diagnosis present

## 2023-11-02 ENCOUNTER — Other Ambulatory Visit: Payer: Self-pay | Admitting: Surgery

## 2023-11-02 DIAGNOSIS — R1013 Epigastric pain: Secondary | ICD-10-CM

## 2023-11-06 ENCOUNTER — Ambulatory Visit: Payer: Self-pay | Admitting: Surgery

## 2023-11-06 NOTE — H&P (Signed)
 Subjective:    CC: epigastric pain, n/v.    HPI:   Subjective Jacob Acevedo is a 28 y.o. male who was referred by Aviva Lemmings, NP for evaluation of above CC. Symptoms were first noted a few months ago. Pain is intermittent. Patient reports having a total of two episodes of profuse nausea, vomiting, and diarrhea that lasted about 8 hours. He states he had fatty food both nights such as taco [employer], cook-out, and [place] food. He reports epigastric pain associated with these episodes. 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 No one at home sick. Denies any recent traveling. Denies any added supplements.      Past Medical History:  has no past medical history on file.   Past Surgical History:  has a past surgical history that includes Tonsillectomy.   Family History: family history includes Brain cancer in his paternal grandfather; Diabetes type II in his father, maternal grandfather, and paternal grandfather.   Social History:  reports that he has quit smoking. He uses smokeless tobacco. He reports that he does not drink alcohol and does not use drugs.   Current Medications: has a current medication list which includes the following prescription(s): montelukast.   Allergies:     Allergies as of 10/31/2023   (No Known Allergies)      ROS:  A 15 point review of systems was performed and pertinent positives and negatives noted in HPI    Objective:      BP 115/77   Pulse 91   Ht 177.8 cm (5\' 10" )   Wt (!) 101.6 kg (224 lb)   BMI 32.14 kg/m      Constitutional :  No distress, cooperative, alert  Gastrointestinal: Soft, non-tender, non-distended, no organomegaly.   Musculoskeletal: Steady gait and movement  Skin: Cool and moist  Psychiatric: Normal affect, non-agitated, not confused         LABS:  -      Lab Results  Component Value Date    AST 70 (H) 10/26/2023    ALT 184  (H) 10/26/2023    ALKPHOS 54 10/26/2023    TBILI 0.9 10/26/2023    ALB 5.0 (H) 10/26/2023    TOTALPROTEIN 7.0 10/26/2023      RADS: ULTRASOUND ABDOMEN LIMITED RIGHT UPPER QUADRANT (10/26/2023)  COMPARISON: September 22, 2021  FINDINGS: Gallbladder:  No gallstones or wall thickening visualized. Possible sludge versus artifact. No sonographic Murphy sign noted by sonographer.  Common bile duct:  Diameter: Visualized portion measures 3 mm, within normal limits.  Liver:  No focal lesion identified. Focal fatty sparing along the gallbladder fossa. Diffusely increased in parenchymal echogenicity with poor acoustic penetration. Portal vein is grossly patent on color Doppler imaging with normal direction of blood flow towards the liver.  Other: None.  IMPRESSION: Hepatic steatosis.      Assessment:    Nausea and vomiting, unspecified (R11.2): Based on clinical history, labs, imaging, and physical exam, cause of nausea and vomiting could be associated with gallbladder. Extensive work-up has been done to rule out other causes such as hepatitis, pancreatitis, stool sample was negative, GGT and LDH was negative. Normal Alk phos. LFTs were elevated, patient has a history of elevated liver enzymes. While the elevated LFTs could be related to gallbladder, patient also has history of alcohol use. US  performed on 05/01 showed possible sludge, with no gallstones or gallbladder wall thickening. While the episodic presentation  points towards gallbladder, the nausea and vomiting being the main concern is not common. Having rule out other possible etiologies, there is enough evidence to proceed with surgery. Discussed doing further studies such as HIDA to further evaluate gallbladder function. Patient would like to proceed with HIDA scan.     Plan:    1. Nausea and vomiting, unspecified (R11.2): Having rule out other possible etiologies, there is enough evidence to proceed with surgery. Discussed  doing further studies such as HIDA to further evaluate gallbladder function. Patient would like to proceed with HIDA scan. If HIDA scan comes back normal, no surgery would neccessary. Patient agrees with the plan.      The patient understands the risks, any and all questions were answered to the patient's satisfaction.  UPDATE: pt now requesting proceeding with surgery. Will schedule. R/b/a discussed

## 2023-11-09 ENCOUNTER — Ambulatory Visit

## 2023-11-16 ENCOUNTER — Emergency Department
Admission: EM | Admit: 2023-11-16 | Discharge: 2023-11-16 | Disposition: A | Discharge status: Home / Self Care | Attending: Emergency Medicine | Admitting: Emergency Medicine

## 2023-11-16 ENCOUNTER — Other Ambulatory Visit: Payer: Self-pay

## 2023-11-16 ENCOUNTER — Emergency Department

## 2023-11-16 DIAGNOSIS — R1013 Epigastric pain: Secondary | ICD-10-CM | POA: Diagnosis present

## 2023-11-16 DIAGNOSIS — K8020 Calculus of gallbladder without cholecystitis without obstruction: Secondary | ICD-10-CM | POA: Insufficient documentation

## 2023-11-16 LAB — CBC
HCT: 43.1 % (ref 39.0–52.0)
Hemoglobin: 15 g/dL (ref 13.0–17.0)
MCH: 30.4 pg (ref 26.0–34.0)
MCHC: 34.8 g/dL (ref 30.0–36.0)
MCV: 87.4 fL (ref 80.0–100.0)
Platelets: 187 10*3/uL (ref 150–400)
RBC: 4.93 MIL/uL (ref 4.22–5.81)
RDW: 12.1 % (ref 11.5–15.5)
WBC: 6.1 10*3/uL (ref 4.0–10.5)
nRBC: 0 % (ref 0.0–0.2)

## 2023-11-16 LAB — COMPREHENSIVE METABOLIC PANEL WITH GFR
ALT: 98 U/L — ABNORMAL HIGH (ref 0–44)
AST: 48 U/L — ABNORMAL HIGH (ref 15–41)
Albumin: 4.5 g/dL (ref 3.5–5.0)
Alkaline Phosphatase: 44 U/L (ref 38–126)
Anion gap: 9 (ref 5–15)
BUN: 12 mg/dL (ref 6–20)
CO2: 24 mmol/L (ref 22–32)
Calcium: 9.6 mg/dL (ref 8.9–10.3)
Chloride: 107 mmol/L (ref 98–111)
Creatinine, Ser: 0.88 mg/dL (ref 0.61–1.24)
GFR, Estimated: >60 mL/min (ref >60)
Glucose, Bld: 137 mg/dL — ABNORMAL HIGH (ref 70–99)
Potassium: 3.5 mmol/L (ref 3.5–5.1)
Sodium: 140 mmol/L (ref 135–145)
Total Bilirubin: 1.1 mg/dL (ref 0.0–1.2)
Total Protein: 7.2 g/dL (ref 6.5–8.1)

## 2023-11-16 LAB — LIPASE, BLOOD: Lipase: 47 U/L (ref 11–51)

## 2023-11-16 MED ORDER — ONDANSETRON 4 MG PO TBDP
4.0000 mg | ORAL_TABLET | Freq: Three times a day (TID) | ORAL | 0 refills | Status: AC | PRN
Start: 2023-11-16 — End: ?

## 2023-11-16 NOTE — Discharge Instructions (Addendum)
 Your exam, labs, and abdominal ultrasound are essentially normal. There is no evidence of gallstones or gallbladder infection. Follow-up with Dr. Rosea Conch for surgical intervention as planned.

## 2023-11-16 NOTE — ED Triage Notes (Signed)
 Pt to ED via POV c/o upper abd pain. Pt reports this started when he was eating dinner around 5:30 tonight. Pain has worsened throughout the night. Pt denies vomiting, diarrhea, CP, SOB, fevers, dizziness. Pt reports he has had issues with gallbladder before, no gallstones, and is supposed to have gallbladder taken out in June.

## 2023-11-16 NOTE — ED Provider Notes (Signed)
 [facility] Emergency Department Provider Note     Event Date/Time   First MD Initiated Contact with Patient 11/16/23 2149     (approximate)   History   Abdominal Pain   HPI  Jacob Acevedo is a 28 y.o. male with a history of episode of epigastric, pain with associated nausea and vomiting.  Patient presents to the ED endorsing postprandial pain.  He denies any current nausea, vomiting, diarrhea, chest pain, shortness of breath.  No fevers, chills, or anorexia reported.  Patient has been evaluated by general surgery for an elective cholecystectomy.  He presents to the ED for evaluation of symptoms, which have now resolved.  Physical Exam   Triage Vital Signs: ED Triage Vitals  Encounter Vitals Group     BP 11/16/23 1922 135/86     Systolic BP Percentile --      Diastolic BP Percentile --      Pulse Rate 11/16/23 1922 98     Resp 11/16/23 1922 18     Temp 11/16/23 1922 98.4 F (36.9 C)     Temp Source 11/16/23 2150 Oral     SpO2 11/16/23 1922 99 %     Weight 11/16/23 1923 225 lb (102.1 kg)     Height 11/16/23 1923 5\' 10"  (1.778 m)     Head Circumference --      Peak Flow --      Pain Score 11/16/23 1923 7     Pain Loc --      Pain Education --      Exclude from Growth Chart --     Most recent vital signs: Vitals:   11/16/23 1922 11/16/23 2150  BP: 135/86 113/67  Pulse: 98 77  Resp: 18 17  Temp: 98.4 F (36.9 C) 98.2 F (36.8 C)  SpO2: 99% 96%    General Awake, no distress. NAD HEENT NCAT. PERRL. EOMI. No rhinorrhea. Mucous membranes are moist.  CV:  Good peripheral perfusion. RRR RESP:  Normal effort. CTA ABD:  No distention.  Soft and nontender.  Normal bowel sounds x 4.  No rebound, guarding, or rigidity noted.   ED Results / Procedures / Treatments   Labs (all labs ordered are listed, but only abnormal results are displayed) Labs Reviewed  COMPREHENSIVE METABOLIC PANEL WITH GFR - Abnormal; Notable for the following  components:      Result Value   Glucose, Bld 137 (*)    AST 48 (*)    ALT 98 (*)    All other components within normal limits  LIPASE, BLOOD  CBC  URINALYSIS, ROUTINE W REFLEX MICROSCOPIC    EKG   RADIOLOGY  I personally viewed and evaluated these images as part of my medical decision making, as well as reviewing the written report by the radiologist.  ED Provider Interpretation: No acute gallbladder disease  US  Abdomen Limited RUQ (LIVER/GB) Result Date: 11/16/2023 CLINICAL DATA:  Upper abdominal pain. EXAM: ULTRASOUND ABDOMEN LIMITED RIGHT UPPER QUADRANT COMPARISON:  Oct 27, 2023 FINDINGS: Gallbladder: No gallstones or wall thickening visualized (1.7 mm). No sonographic Murphy sign noted by sonographer. Common bile duct: Diameter: 2.5 mm Liver: Diffusely increased echogenicity of the liver parenchyma is noted. A 1.3 cm x 0.8 cm x 1.1 cm area of focal fatty sparing is suspected within the right lobe of the liver. No abnormal flow is seen within this region on color Doppler evaluation. Portal vein is patent on color Doppler imaging with normal direction of blood  flow towards the liver. Other: None. IMPRESSION: Hepatic steatosis with a small area of suspected focal fatty sparing. Electronically Signed   By: Virgle Grime M.D.   On: 11/16/2023 20:38     PROCEDURES:  Critical Care performed: No  Procedures   MEDICATIONS ORDERED IN ED: Medications - No data to display   IMPRESSION / MDM / ASSESSMENT AND PLAN / ED COURSE  I reviewed the triage vital signs and the nursing notes.                              Differential diagnosis includes, but is not limited to, biliary disease (biliary colic, acute cholecystitis, cholangitis, choledocholithiasis, etc), intrathoracic causes for epigastric abdominal pain including ACS, gastritis, duodenitis, pancreatitis, small bowel or large bowel obstruction, abdominal aortic aneurysm, hernia, and ulcer(s).  Patient's presentation is most  consistent with acute complicated illness / injury requiring diagnostic workup.  Patient's diagnosis is consistent with epigastric pain and gallbladder colic without evidence of acute intractable nausea/vomiting.  Patient with reassuring exam and workup at this time.  No acute lab normalities are noted including leukocytosis or anemia, or significant LFT elevations.  Ultrasound interpreted by me, shows no evidence of gallbladder sludge, gallbladder wall thickening, or gallstones.  Patient is stable at this time without ongoing complaint.  Patient will be discharged home with prescriptions for Zofran . Patient is to follow up with Dr. Rosea Conch as planned, as needed or otherwise directed. Patient is given ED precautions to return to the ED for any worsening or new symptoms.   FINAL CLINICAL IMPRESSION(S) / ED DIAGNOSES   Final diagnoses:  Epigastric pain  Gallbladder colic     Rx / DC Orders   ED Discharge Orders          Ordered    ondansetron  (ZOFRAN -ODT) 4 MG disintegrating tablet  Every 8 hours PRN        11/16/23 2242             Note:  This document was prepared using Dragon voice recognition software and may include unintentional dictation errors.    May Sparks, PA-C 11/16/23 2247    Twilla Galea, MD 11/16/23 [phone]

## 2023-11-21 ENCOUNTER — Other Ambulatory Visit: Payer: Self-pay | Admitting: Family Medicine

## 2023-11-21 DIAGNOSIS — R1013 Epigastric pain: Secondary | ICD-10-CM

## 2023-11-21 DIAGNOSIS — R1011 Right upper quadrant pain: Secondary | ICD-10-CM

## 2023-11-21 DIAGNOSIS — R112 Nausea with vomiting, unspecified: Secondary | ICD-10-CM

## 2023-11-23 ENCOUNTER — Other Ambulatory Visit: Payer: Self-pay

## 2023-11-23 ENCOUNTER — Encounter: Payer: Self-pay | Admitting: Surgery

## 2023-11-23 ENCOUNTER — Encounter
Admission: RE | Admit: 2023-11-23 | Discharge: 2023-11-23 | Disposition: A | Discharge status: Home / Self Care | Source: Ambulatory Visit | Attending: Surgery | Admitting: Surgery

## 2023-11-23 NOTE — Patient Instructions (Addendum)
 Your procedure is scheduled on: Friday, June 11/2023  Report to the Registration Desk on the 1st floor of the [facility]. To find out your arrival time, please call [phone] between 1PM - 3PM on: Thursday, June 5/ 2025 If your arrival time is 6:00 am, do not arrive before that time as the [facility] entrance doors do not open until 6:00 am.  REMEMBER: Instructions that are not followed completely may result in serious medical risk, up to and including death; or upon the discretion of your surgeon and anesthesiologist your surgery may need to be rescheduled.  Do not eat food and drink anything after midnight the night before surgery.  No gum chewing or hard candies.   One week prior to surgery: Stop Anti-inflammatories (NSAIDS) such as Advil , Aleve, Ibuprofen , Motrin , Naproxen, Naprosyn and Aspirin based products such as Excedrin, Goody's Powder, BC Powder. Stop ANY OVER THE COUNTER supplements until after surgery.  You may however, continue to take Tylenol  if needed for pain up until the day of surgery.  Continue taking all of your other prescription medications up until the day of surgery.  Do not take cetirizine (ZYRTEC)on day of surgery   No Alcohol for 24 hours before or after surgery.  No Smoking including e-cigarettes for 24 hours before surgery.  No chewable tobacco products for at least 6 hours before surgery.  No nicotine patches on the day of surgery.  Do not use any "recreational" drugs for at least a week (preferably 2 weeks) before your surgery.  Please be advised that the combination of cocaine and anesthesia may have negative outcomes, up to and including death. If you test positive for cocaine, your surgery will be cancelled.  On the morning of surgery brush your teeth with toothpaste and water, you may rinse your mouth with mouthwash if you wish. Do not swallow any toothpaste or mouthwash.  Use CHG Soap or wipes as directed on instruction sheet.-provided  for you   Do not wear jewelry, make-up, hairpins, clips or nail polish.   Do not wear lotions, powders, or perfumes.   Do not shave body hair from the neck down 48 hours before surgery.  Contact lenses, hearing aids and dentures may not be worn into surgery.  Do not bring valuables to the hospital. [facility] is not responsible for any missing/lost belongings or valuables.    Notify your doctor if there is any change in your medical condition (cold, fever, infection).  Wear comfortable clothing (specific to your surgery type) to the hospital.  After surgery, you can help prevent lung complications by doing breathing exercises.  Take deep breaths and cough every 1-2 hours. Your doctor may order a device called an Incentive Spirometer to help you take deep breaths. When coughing or sneezing, hold a pillow firmly against your incision with both hands. This is called "splinting." Doing this helps protect your incision. It also decreases belly discomfort.   If you are being discharged the day of surgery, you will not be allowed to drive home. You will need a responsible individual to drive you home and stay with you for 24 hours after surgery.    Please call the Pre-admissions Testing Dept. at [phone] if you have any questions about these instructions.  Surgery Visitation Policy:  Patients having surgery or a procedure may have two visitors.  Children under the age of 19 must have an adult with them who is not the patient.    Preparing for Surgery with  CHLORHEXIDINE GLUCONATE (CHG) Soap  Chlorhexidine Gluconate (CHG) Soap  o An antiseptic cleaner that kills germs and bonds with the skin to continue killing germs even after washing  o Used for showering the night before surgery and morning of surgery  Before surgery, you can play an important role by reducing the number of germs on your skin.  CHG (Chlorhexidine gluconate) soap is an antiseptic cleanser which kills  germs and bonds with the skin to continue killing germs even after washing.  Please do not use if you have an allergy to CHG or antibacterial soaps. If your skin becomes reddened/irritated stop using the CHG.  1. Shower the NIGHT BEFORE SURGERY and the MORNING OF SURGERY with CHG soap.  2. If you choose to wash your hair, wash your hair first as usual with your normal shampoo.  3. After shampooing, rinse your hair and body thoroughly to remove the shampoo.  4. Use CHG as you would any other liquid soap. You can apply CHG directly to the skin and wash gently with a scrungie or a clean washcloth.  5. Apply the CHG soap to your body only from the neck down. Do not use on open wounds or open sores. Avoid contact with your eyes, ears, mouth, and genitals (private parts). Wash face and genitals (private parts) with your normal soap.  6. Wash thoroughly, paying special attention to the area where your surgery will be performed.  7. Thoroughly rinse your body with warm water.  8. Do not shower/wash with your normal soap after using and rinsing off the CHG soap.  9. Pat yourself dry with a clean towel.  10. Wear clean pajamas to bed the night before surgery.  12. Place clean sheets on your bed the night of your first shower and do not sleep with pets.  13. Shower again with the CHG soap on the day of surgery prior to arriving at the hospital.  14. Do not apply any deodorants/lotions/powders.  15. Please wear clean clothes to the hospital.

## 2023-11-24 ENCOUNTER — Ambulatory Visit
Admission: RE | Admit: 2023-11-24 | Discharge: 2023-11-24 | Disposition: A | Discharge status: Home / Self Care | Source: Ambulatory Visit | Attending: Family Medicine | Admitting: Family Medicine

## 2023-11-24 DIAGNOSIS — R112 Nausea with vomiting, unspecified: Secondary | ICD-10-CM

## 2023-11-24 DIAGNOSIS — R1011 Right upper quadrant pain: Secondary | ICD-10-CM

## 2023-11-24 DIAGNOSIS — R1013 Epigastric pain: Secondary | ICD-10-CM

## 2023-11-24 MED ORDER — IOPAMIDOL (ISOVUE-300) INJECTION 61%
100.0000 mL | Freq: Once | INTRAVENOUS | Status: AC | PRN
  Administered 2023-11-24: 100 mL via INTRAVENOUS

## 2023-12-01 ENCOUNTER — Encounter: Admission: RE | Payer: Self-pay | Source: Home / Self Care

## 2023-12-01 ENCOUNTER — Ambulatory Visit: Admission: RE | Admit: 2023-12-01 | Source: Home / Self Care | Admitting: Surgery

## 2023-12-01 HISTORY — DX: Calculus of bile duct without cholangitis or cholecystitis without obstruction: K80.50

## 2023-12-01 SURGERY — CHOLECYSTECTOMY, ROBOT-ASSISTED, LAPAROSCOPIC
Anesthesia: General | Site: Abdomen
